# Patient Record
Sex: Female | Born: 1965 | Race: White | Hispanic: No | Marital: Married | Smoking: Never smoker
Health system: Southern US, Community
[De-identification: ages and names within clinical notes are randomized; demographics above are authoritative.]

## PROBLEM LIST (undated history)

## (undated) DIAGNOSIS — E079 Disorder of thyroid, unspecified: Secondary | ICD-10-CM

## (undated) HISTORY — DX: Disorder of thyroid, unspecified: E07.9

---

## 1998-01-17 ENCOUNTER — Other Ambulatory Visit: Admission: RE | Admit: 1998-01-17 | Discharge: 1998-01-17 | Payer: Self-pay | Admitting: Obstetrics & Gynecology

## 1998-09-27 ENCOUNTER — Other Ambulatory Visit: Admission: RE | Admit: 1998-09-27 | Discharge: 1998-09-27 | Payer: Self-pay | Admitting: *Deleted

## 1999-03-13 ENCOUNTER — Ambulatory Visit (HOSPITAL_COMMUNITY): Admission: AD | Admit: 1999-03-13 | Discharge: 1999-03-13 | Payer: Self-pay | Admitting: Obstetrics and Gynecology

## 1999-03-13 ENCOUNTER — Encounter: Payer: Self-pay | Admitting: Obstetrics & Gynecology

## 1999-05-01 ENCOUNTER — Inpatient Hospital Stay (HOSPITAL_COMMUNITY): Admission: AD | Admit: 1999-05-01 | Discharge: 1999-05-05 | Payer: Self-pay | Admitting: Obstetrics and Gynecology

## 1999-09-30 ENCOUNTER — Other Ambulatory Visit: Admission: RE | Admit: 1999-09-30 | Discharge: 1999-09-30 | Payer: Self-pay | Admitting: Obstetrics & Gynecology

## 2000-10-19 ENCOUNTER — Other Ambulatory Visit: Admission: RE | Admit: 2000-10-19 | Discharge: 2000-10-19 | Payer: Self-pay | Admitting: Obstetrics and Gynecology

## 2001-09-21 ENCOUNTER — Other Ambulatory Visit: Admission: RE | Admit: 2001-09-21 | Discharge: 2001-09-21 | Payer: Self-pay | Admitting: Obstetrics and Gynecology

## 2003-04-21 ENCOUNTER — Encounter: Admission: RE | Admit: 2003-04-21 | Discharge: 2003-04-21 | Payer: Self-pay | Admitting: Obstetrics and Gynecology

## 2003-04-21 ENCOUNTER — Encounter: Payer: Self-pay | Admitting: Obstetrics and Gynecology

## 2003-06-15 ENCOUNTER — Other Ambulatory Visit: Admission: RE | Admit: 2003-06-15 | Discharge: 2003-06-15 | Payer: Self-pay | Admitting: Obstetrics and Gynecology

## 2004-06-20 ENCOUNTER — Other Ambulatory Visit: Admission: RE | Admit: 2004-06-20 | Discharge: 2004-06-20 | Payer: Self-pay | Admitting: Obstetrics and Gynecology

## 2004-12-27 ENCOUNTER — Emergency Department (HOSPITAL_COMMUNITY): Admission: EM | Admit: 2004-12-27 | Discharge: 2004-12-27 | Payer: Self-pay | Admitting: Emergency Medicine

## 2004-12-27 ENCOUNTER — Ambulatory Visit: Payer: Self-pay | Admitting: *Deleted

## 2004-12-31 ENCOUNTER — Ambulatory Visit: Payer: Self-pay | Admitting: Internal Medicine

## 2005-01-07 ENCOUNTER — Encounter (HOSPITAL_COMMUNITY): Admission: RE | Admit: 2005-01-07 | Discharge: 2005-04-07 | Payer: Self-pay | Admitting: Internal Medicine

## 2005-02-06 ENCOUNTER — Ambulatory Visit: Payer: Self-pay | Admitting: Endocrinology

## 2005-02-25 ENCOUNTER — Ambulatory Visit: Payer: Self-pay | Admitting: Endocrinology

## 2005-03-25 ENCOUNTER — Ambulatory Visit: Payer: Self-pay | Admitting: Endocrinology

## 2005-04-15 ENCOUNTER — Ambulatory Visit: Payer: Self-pay | Admitting: Cardiology

## 2005-10-07 ENCOUNTER — Other Ambulatory Visit: Admission: RE | Admit: 2005-10-07 | Discharge: 2005-10-07 | Payer: Self-pay | Admitting: Obstetrics and Gynecology

## 2006-05-11 ENCOUNTER — Ambulatory Visit (HOSPITAL_COMMUNITY): Admission: RE | Admit: 2006-05-11 | Discharge: 2006-05-11 | Payer: Self-pay | Admitting: Obstetrics and Gynecology

## 2007-05-31 ENCOUNTER — Ambulatory Visit (HOSPITAL_COMMUNITY): Admission: RE | Admit: 2007-05-31 | Discharge: 2007-05-31 | Payer: Self-pay | Admitting: Obstetrics and Gynecology

## 2008-06-01 ENCOUNTER — Ambulatory Visit (HOSPITAL_COMMUNITY): Admission: RE | Admit: 2008-06-01 | Discharge: 2008-06-01 | Payer: Self-pay | Admitting: Obstetrics and Gynecology

## 2009-06-05 ENCOUNTER — Ambulatory Visit (HOSPITAL_COMMUNITY): Admission: RE | Admit: 2009-06-05 | Discharge: 2009-06-05 | Payer: Self-pay | Admitting: Obstetrics and Gynecology

## 2010-08-31 ENCOUNTER — Encounter: Payer: Self-pay | Admitting: Obstetrics and Gynecology

## 2010-12-27 NOTE — Consult Note (Signed)
NAMEULLA, MCKIERNAN               ACCOUNT NO.:  [id number]   MEDICAL RECORD NO.:  [id number]           PATIENT TYPE:   LOCATION:                               FACILITY:  [facility]   PHYSICIAN:  Rollene Rotunda, M.D.   DATE OF BIRTH:  1965/11/17   DATE OF CONSULTATION:  12/27/2004  DATE OF DISCHARGE:                                   CONSULTATION   CONSULTING PHYSICIAN:  Rollene Rotunda, M.D.   PRIMARY:  [facility].   REASON FOR PRESENTATION:  A patient with chest discomfort.   HISTORY OF PRESENT ILLNESS:  The patient is a pleasant 45 year old white  female with no past medical history or cardiac history.  She is quite  active.  Today while talking with her husband, she felt some left substernal  chest discomfort.  This was a tightness and pressure with a knotting.  It  was exactly the same as indigestion she had at the end of her pregnancy.  It  waxed and waned over 40 minutes.  She also had some right jaw and neck  tingling that she did not experience before.  She called the [facility] and went to her primary care office at [facility].  There she was urged to come to the emergency room.  She took aspirin.  Incidentally, Tums at home did not seem to improve this pain.  She had no  associated symptoms such as nausea, vomiting, or diaphoresis.  She has had  no palpitations, presyncope, or syncope.  She has had no shortness of  breath, no PND, or orthopnea.  She is very active.  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  She exercises almost daily.  With this aggressive  level of activity including this week, she has no cardiac symptoms.   PAST MEDICAL HISTORY:  There is no history of hypertension, diabetes, or  hyperlipidemia.   PAST SURGICAL HISTORY:  None.   ALLERGIES:  None.   MEDICATIONS:  Multivitamin, calcium supplements.   SOCIAL HISTORY:  The patient lives with her husband and son of 5.5-years.  She is a stay at home Mom.  She exercises  routinely.  She has never smoked  cigarettes.  She does not drink alcohol.   FAMILY HISTORY:  Contributory for a Mom having peripheral vascular disease  but no cardiac problems.  Both her Mom and her Dad smoke heavily.  Otherwise  there are no first degree relatives with early onset coronary disease.   REVIEW OF SYSTEMS:  As stated in the HPI.  Negative for other systems.   PHYSICAL EXAMINATION:  GENERAL:  The patient is well appearing in no  distress.  VITAL SIGNS:  Blood pressure 114/65, heart rate 72 and regular, afebrile.  HEENT:  Eyelids unremarkable.  Pupils equal, round, reactive to light.  Fundi not visualized.  Oral mucosa unremarkable.  NECK:  No jugular venous distention.  Wave form within normal limits.  Carotid upstroke brisk and symmetric.  No bruits.  No thyromegaly.  LYMPHATICS:  No cervical, axillary, or inguinal adenopathy.  LUNGS:  Clear to auscultation  bilaterally.  BACK:  No costovertebral angle tenderness.  CHEST:  Unremarkable.  HEART:  PMI not displaced or sustained.  S1 and S2 within normal limits.  No  S3.  No S4.  No murmurs.  ABDOMEN:  Flat.  Positive bowel sounds, normal in frequency and pitch.  No  bruits.  No rebound.  No guarding.  No midline pulsatile mass.  No  hepatomegaly.  No splenomegaly.  SKIN:  No rashes.  No nodules.  EXTREMITIES:  With 2+ pulses throughout.  No edema.  No cyanosis.  No  clubbing.  NEUROLOGIC:  Oriented to person, place and time.  Cranial nerves II-XII  grossly intact.  Motor grossly intact.   EKG:  Sinus rhythm, rightward axis, intervals within normal limits, no acute  ST-T wave changes.   LABS:  WBC 6.5, hemoglobin 15.2, sodium 138, potassium 4.1, BUN 13,  creatinine 0.9.  Magnesium 2.4.  D-dimmer 0.23.  PT 12.5, INR 0.9.  Calcium  10, total protein 7.4, albumin 4.2, AST 27, ALT 57, alkaline phosphatase 68,  bilirubin 0.7.  Troponin 0.01, MB 0.4, CK 45.   ASSESSMENT/PLAN:  Chest pain.  The patient's chest discomfort is  very  atypical for angina.  She is extremely functional and active without ever  developing any symptoms.  She has very minimal risk factors with a  borderline family history.  She has a normal physical exam and normal EKG.  Her set of cardiac enzymes are unremarkable.  Given all of this, the pretest  probability of obstructive coronary disease leading to her symptoms is  extremely low.  At this point, I think it is very safe to send her home.  She can come back if she has any recurrent chest discomfort.  We will plan  on following her up in the office, and we will perform an exercise treadmill  test.                                        ___________________________________________  Rollene Rotunda, M.D.    JH/MEDQ  D:  12/27/2004  T:  12/28/2004  Job:  [id number]

## 2011-09-29 ENCOUNTER — Other Ambulatory Visit: Payer: Self-pay | Admitting: Family Medicine

## 2011-09-29 MED ORDER — LEVOTHYROXINE SODIUM 75 MCG PO TABS: 75.0000 ug | ORAL_TABLET | Freq: Every day | ORAL | Status: DC

## 2011-11-04 ENCOUNTER — Ambulatory Visit (INDEPENDENT_AMBULATORY_CARE_PROVIDER_SITE_OTHER): Payer: BC Managed Care – PPO | Admitting: Internal Medicine

## 2011-11-04 VITALS — BP 107/66 | HR 57 | Temp 98.7°F | Resp 18 | Ht 65.35 in | Wt 153.6 lb

## 2011-11-04 DIAGNOSIS — E039 Hypothyroidism, unspecified: Secondary | ICD-10-CM

## 2011-11-04 LAB — T4, FREE: Free T4: 1.38 ng/dL (ref 0.80–1.80)

## 2011-11-04 MED ORDER — LEVOTHYROXINE SODIUM 75 MCG PO TABS: 75.0000 ug | ORAL_TABLET | Freq: Every day | ORAL | Status: DC

## 2011-11-04 NOTE — Progress Notes (Signed)
  Subjective:    Patient ID: Melissa Mosley, female    DOB: [birth year], 46 y.o.   MRN: [id number]  HPIHere for yearly followup of hypothyroidism Doing very well Advanced to 4 level akido Lives with husband and 40 year old / 82 year old sonAnd needs occasional family therapy to keep this together Counseling practice on [address]is going well    Review of SystemsNegative    Objective:   Physical Exam Vital signs are stable No thyromegaly or nodules       Assessment & Plan:  Hypothyroidism  Check T3-T4 TSH Refilled Synthroid 75 mcg for one year

## 2011-11-05 ENCOUNTER — Encounter: Payer: Self-pay | Admitting: Internal Medicine

## 2011-11-06 ENCOUNTER — Telehealth: Payer: Self-pay

## 2011-11-06 NOTE — Telephone Encounter (Signed)
Pt expected a call yesterday, needs her thyroid results so she/we can determine if current dosage of meds is appropriate.

## 2011-11-06 NOTE — Telephone Encounter (Signed)
lmom that all labs are normal per dr Merla Riches and to continue with current medication regimen.

## 2011-11-19 ENCOUNTER — Ambulatory Visit (INDEPENDENT_AMBULATORY_CARE_PROVIDER_SITE_OTHER): Payer: BC Managed Care – PPO | Admitting: Internal Medicine

## 2011-11-19 ENCOUNTER — Ambulatory Visit
Admission: RE | Admit: 2011-11-19 | Discharge: 2011-11-19 | Disposition: A | Discharge status: Home / Self Care | Payer: Self-pay | Source: Ambulatory Visit | Attending: Internal Medicine | Admitting: Internal Medicine

## 2011-11-19 ENCOUNTER — Encounter: Payer: Self-pay | Admitting: Internal Medicine

## 2011-11-19 VITALS — BP 112/70 | HR 56 | Temp 98.0°F | Resp 16 | Ht 65.25 in | Wt 154.0 lb

## 2011-11-19 DIAGNOSIS — S0990XA Unspecified injury of head, initial encounter: Secondary | ICD-10-CM

## 2011-11-19 DIAGNOSIS — E039 Hypothyroidism, unspecified: Secondary | ICD-10-CM | POA: Insufficient documentation

## 2011-11-19 DIAGNOSIS — M542 Cervicalgia: Secondary | ICD-10-CM

## 2011-11-19 DIAGNOSIS — S069X0A Unspecified intracranial injury without loss of consciousness, initial encounter: Secondary | ICD-10-CM

## 2011-11-19 DIAGNOSIS — S0993XA Unspecified injury of face, initial encounter: Secondary | ICD-10-CM

## 2011-11-19 MED ORDER — CYCLOBENZAPRINE HCL 5 MG PO TABS
5.0000 mg | ORAL_TABLET | Freq: Three times a day (TID) | ORAL | Status: AC | PRN
Start: 2011-11-19 — End: 2011-11-29

## 2011-11-19 MED ORDER — KETOROLAC TROMETHAMINE 10 MG PO TABS: 10.0000 mg | ORAL_TABLET | Freq: Four times a day (QID) | ORAL | Status: AC | PRN

## 2011-11-19 NOTE — Progress Notes (Signed)
  Subjective:    Patient ID: Melissa Mosley, female    DOB: [birth year], 46 y.o.   MRN: [id number]  HPI Headache 6/10 Neck pain 5/10 in base of hte skull midline Nausea Dizzy Onset 2 days ago fell backwards during martial arts activity onto partners knee and then mat. No loc was stunned for a few minutes. Nausea and dizzy. Severe ha 8/10 in severity. Nausea and  photophobia has been worse over the past 2 days. Feels slightly unbalanced on the right side when walking. No cognitive abnormalities but it" hurts to think."    Review of Systems  Constitutional: Negative.   HENT: Positive for neck pain and neck stiffness.   Eyes: Negative.   Respiratory: Negative.   Cardiovascular: Negative.   Genitourinary: Negative.   Musculoskeletal: Positive for gait problem.  Skin: Negative.   Neurological: Positive for dizziness, light-headedness and headaches. Negative for syncope, weakness and numbness.  Hematological: Negative.   Psychiatric/Behavioral: Negative.   All other systems reviewed and are negative.       Objective:   Physical Exam  Nursing note and vitals reviewed. Constitutional: She is oriented to person, place, and time. She appears well-developed and well-nourished.  HENT:  Head: Normocephalic.  Right Ear: External ear normal.  Left Ear: External ear normal.  Nose: Nose normal.  Mouth/Throat: Oropharynx is clear and moist.       Tender at the base of the skull  Neck:       Tender base of the skull midline   Cardiovascular: Normal rate, regular rhythm and normal heart sounds.   Pulmonary/Chest: Effort normal and breath sounds normal.  Abdominal: Soft.  Musculoskeletal: She exhibits tenderness.       c spine midline  Neurological: She is alert and oriented to person, place, and time. She has normal reflexes. She displays normal reflexes. No cranial nerve deficit. She exhibits normal muscle tone. Coordination abnormal.       Cerebellar testing normal gross cognition normal    Skin: Skin is warm and dry.  Psychiatric: She has a normal mood and affect. Her behavior is normal. Judgment and thought content normal.          Assessment & Plan:  Concussive symptoms with neck pain and concern for cervical neck injury . Will obtain ct of the head and neck and rx with analgesics and muscle relaxants.

## 2011-11-19 NOTE — Patient Instructions (Signed)
Take meds as directed. Ct of head and neck today. Return for follow up as directed.

## 2011-11-26 ENCOUNTER — Telehealth: Payer: Self-pay

## 2011-11-26 NOTE — Telephone Encounter (Signed)
If pain/problems  have not improved needs to return to clinic (per Dr. Sanjuana Letters advice) This way we can evaluate, treat  and refer for current symptoms/issues.  (Has been over a week since injury)

## 2011-11-26 NOTE — Telephone Encounter (Signed)
Pt seen in office by Dr Mindi Junker diagnosed with a concussion she would like a call back to see if someone can refer her to a specialist who knows more about her diagnosis.

## 2011-11-26 NOTE — Telephone Encounter (Signed)
Patient had made other arrangements to be seen somewhere else.

## 2011-12-02 ENCOUNTER — Ambulatory Visit: Payer: BC Managed Care – PPO | Attending: Family Medicine | Admitting: Physical Therapy

## 2011-12-02 ENCOUNTER — Ambulatory Visit: Payer: BC Managed Care – PPO | Admitting: Rehabilitative and Restorative Service Providers"

## 2011-12-02 DIAGNOSIS — R269 Unspecified abnormalities of gait and mobility: Secondary | ICD-10-CM | POA: Insufficient documentation

## 2011-12-02 DIAGNOSIS — IMO0001 Reserved for inherently not codable concepts without codable children: Secondary | ICD-10-CM | POA: Insufficient documentation

## 2011-12-04 ENCOUNTER — Encounter: Payer: BC Managed Care – PPO | Admitting: Physical Therapy

## 2011-12-09 ENCOUNTER — Ambulatory Visit: Payer: BC Managed Care – PPO | Admitting: Physical Therapy

## 2011-12-12 ENCOUNTER — Ambulatory Visit (INDEPENDENT_AMBULATORY_CARE_PROVIDER_SITE_OTHER): Payer: BC Managed Care – PPO | Admitting: Obstetrics and Gynecology

## 2011-12-12 ENCOUNTER — Encounter: Payer: Self-pay | Admitting: Obstetrics and Gynecology

## 2011-12-12 VITALS — BP 102/56 | Resp 16 | Ht 66.0 in | Wt 153.0 lb

## 2011-12-12 DIAGNOSIS — Z01419 Encounter for gynecological examination (general) (routine) without abnormal findings: Secondary | ICD-10-CM

## 2011-12-12 DIAGNOSIS — Z30432 Encounter for removal of intrauterine contraceptive device: Secondary | ICD-10-CM

## 2011-12-12 NOTE — Progress Notes (Signed)
Subjective:    Melissa Mosley is a 46 y.o. female, No obstetric history on file., who presents for an annual exam.  Had a recent concussion following a fall in [place], recovering now.  Hx hypothyroidism, followed at primary.  No gyn issues. Questioning need/recommended frequency of mammogram.  Last one   History   Social History  . Marital Status: Married    Spouse Name: N/A    Number of Children: N/A  . Years of Education: N/A   Social History Main Topics  . Smoking status: Never Smoker   . Smokeless tobacco: Never Used  . Alcohol Use: No  . Drug Use: No  . Sexually Active: Not on file   Other Topics Concern  . Not on file   Social History Narrative  . No narrative on file    Menstrual cycle:   LMP: No LMP recorded. Patient is not currently having periods (Reason: IUD).           Cycle: Regular, monthly with normal flow and no severe dysmenorrha  The following portions of the patient's history were reviewed and updated as appropriate: allergies, current medications, past family history, past medical history, past social history, past surgical history and problem list.  Review of Systems Pertinent items are noted in HPI. Breast:Negative for breast lump,nipple discharge or nipple retraction Gastrointestinal: Negative for abdominal pain, change in bowel habits or rectal bleeding Urinary:negative   Objective:    BP 102/56  Resp 16  Ht 5\' 6"  (1.676 m)  Wt 153 lb (69.4 kg)  BMI 24.69 kg/m2    Weight:  Wt Readings from Last 1 Encounters:  12/12/11 153 lb (69.4 kg)          BMI: Body mass index is 24.69 kg/(m^2).  General Appearance: Alert, appropriate appearance for age. No acute distress HEENT: Grossly normal Neck / Thyroid: Supple, no masses, nodes or enlargement Lungs: clear to auscultation bilaterally Back: No CVA tenderness Breast Exam: No masses or nodes.No dimpling, nipple retraction or discharge. Cardiovascular: Regular rate and rhythm. S1, S2, no  murmur Gastrointestinal: Soft, non-tender, no masses or organomegaly Pelvic Exam: Vulva and vagina appear normal. Bimanual exam reveals normal uterus and adnexa. Rectovaginal: not indicated and normal rectal, no masses Lymphatic Exam: Non-palpable nodes in neck, clavicular, axillary, or inguinal regions  Skin: no rash or abnormalities Neurologic: Normal gait and speech, no tremor  Psychiatric: Alert and oriented, appropriate affect.  IUD string visible at cervical os--removed without difficulty  Wet Prep:not applicable Urinalysis:not applicable UPT: Not done   Assessment:    Normal gyn exam  Desired IUD removal--done.    Plan:    Pap smear Mammogram discussed--patient willing to do mg every 2-3 years (plans one in 2013). return annually or prn STD screening: declined Contraception:vasectomy (IUD removed today)      Toure Edmonds, VICKIMD

## 2011-12-12 NOTE — Progress Notes (Signed)
The patient reports: pt states that she had concussion a couple weeks ago has been having trouble with bp ever since   Contraception:IUD  Last mammogram: pt had mammo 2011 and was normal  Last pap: states that has been a couple years  GC/Chlamydia cultures offered: declined HIV/RPR/HbsAg offered:  declined HSV 1 and 2 glycoprotein offered: declined  Menstrual cycle regular and monthly: No: IUD Menstrual flow normal: No: IUD  Urinary symptoms: none Normal bowel movements: Yes Reports abuse at home: No:

## 2011-12-16 ENCOUNTER — Encounter: Payer: BC Managed Care – PPO | Admitting: Physical Therapy

## 2011-12-17 LAB — PAP IG W/ RFLX HPV ASCU

## 2012-02-17 ENCOUNTER — Ambulatory Visit (INDEPENDENT_AMBULATORY_CARE_PROVIDER_SITE_OTHER): Payer: BC Managed Care – PPO | Admitting: Physician Assistant

## 2012-02-17 VITALS — BP 122/86 | HR 68 | Temp 98.6°F | Resp 18 | Ht 66.0 in | Wt 154.0 lb

## 2012-02-17 DIAGNOSIS — Z0000 Encounter for general adult medical examination without abnormal findings: Secondary | ICD-10-CM

## 2012-02-17 DIAGNOSIS — Z111 Encounter for screening for respiratory tuberculosis: Secondary | ICD-10-CM

## 2012-02-17 LAB — POCT URINALYSIS DIPSTICK
Blood, UA: NEGATIVE
Glucose, UA: NEGATIVE
Urobilinogen, UA: 0.2
pH, UA: 6.5

## 2012-02-17 NOTE — Progress Notes (Signed)
  Subjective:    Patient ID: Melissa Mosley, female    DOB: July 07, 1966, 46 y.o.   MRN: [id number]  HPI  Pt presents to clinic for a CPE.  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  She had her pap smear 5/13.  She has had normal cholesterol 2010.  Her thyroid was nl in 3/13.  She has no complaints today.  She has been increasing her exercise since her concussion in 4/13 due to HAs with exercise but they are improving.    Review of Systems Neg. See scanned health survey.     Objective:   Physical Exam  Nursing note and vitals reviewed. Constitutional: She is oriented to person, place, and time. Vital signs are normal. She appears well-developed and well-nourished.  HENT:  Head: Normocephalic and atraumatic.  Right Ear: External ear normal.  Left Ear: External ear normal.  Nose: Nose normal.  Mouth/Throat: Oropharynx is clear and moist.  Eyes: Conjunctivae and EOM are normal. Pupils are equal, round, and reactive to light. Right eye exhibits no discharge. No scleral icterus.  Neck: Normal range of motion. Neck supple. Carotid bruit is not present. No tracheal deviation present. No mass and no thyromegaly present.  Cardiovascular: Normal rate, regular rhythm, normal heart sounds and intact distal pulses.  Exam reveals no gallop and no friction rub.   No murmur heard. Pulmonary/Chest: Effort normal and breath sounds normal.  Abdominal: Soft. Normal appearance and bowel sounds are normal. There is no tenderness.  Lymphadenopathy:    She has no cervical adenopathy.  Neurological: She is alert and oriented to person, place, and time. She has normal reflexes.  Skin: Skin is warm and dry.  Psychiatric: She has a normal mood and affect. Her behavior is normal. Judgment and thought content normal.    Results for orders placed in visit on 02/17/12  POCT URINALYSIS DIPSTICK      Component Value Range   Color, UA yellow     Clarity, UA clear     Glucose, UA neg       Bilirubin, UA neg     Ketones, UA neg     Spec Grav, UA 1.020     Blood, UA neg     pH, UA 6.5     Protein, UA trace     Urobilinogen, UA 0.2     Nitrite, UA neg     Leukocytes, UA Trace           Assessment & Plan:   1. Annual physical exam  POCT urinalysis dipstick, TB Skin Test  2. Screening-pulmonary TB     Pt to RTC 48-72 h for PPD reading. Form filled out for school physical.

## 2012-02-17 NOTE — Patient Instructions (Signed)
Given Healthy handout.

## 2012-02-19 ENCOUNTER — Ambulatory Visit (INDEPENDENT_AMBULATORY_CARE_PROVIDER_SITE_OTHER): Payer: BC Managed Care – PPO | Admitting: *Deleted

## 2012-02-19 DIAGNOSIS — Z111 Encounter for screening for respiratory tuberculosis: Secondary | ICD-10-CM

## 2012-03-30 ENCOUNTER — Encounter: Payer: Self-pay | Admitting: Internal Medicine

## 2012-09-07 ENCOUNTER — Ambulatory Visit (INDEPENDENT_AMBULATORY_CARE_PROVIDER_SITE_OTHER): Payer: BC Managed Care – PPO | Admitting: Family Medicine

## 2012-09-07 VITALS — BP 106/66 | HR 60 | Temp 98.0°F | Resp 14 | Ht 65.75 in | Wt 155.0 lb

## 2012-09-07 DIAGNOSIS — S40012A Contusion of left shoulder, initial encounter: Secondary | ICD-10-CM | POA: Insufficient documentation

## 2012-09-07 DIAGNOSIS — S199XXA Unspecified injury of neck, initial encounter: Secondary | ICD-10-CM

## 2012-09-07 DIAGNOSIS — M542 Cervicalgia: Secondary | ICD-10-CM

## 2012-09-07 DIAGNOSIS — S0993XA Unspecified injury of face, initial encounter: Secondary | ICD-10-CM

## 2012-09-07 DIAGNOSIS — S060X9A Concussion with loss of consciousness of unspecified duration, initial encounter: Secondary | ICD-10-CM | POA: Insufficient documentation

## 2012-09-07 DIAGNOSIS — S069X0A Unspecified intracranial injury without loss of consciousness, initial encounter: Secondary | ICD-10-CM

## 2012-09-07 NOTE — Progress Notes (Signed)
Melissa Mosley is a 47 y.o. female who presents to [facility] today for  1) left shoulder pain. Patient is avid Akido participant to 4 weeks ago was doing a roll. She landed on the point of the left shoulder.  This resulted in immediate pain. The pain was mild to moderate however it has persisted for 4 weeks.  She has been avoiding her usual weight lifting and martial arts activity.  She denies any pain with overhand motion or reaching back, or at night. She's tried some over-the-counter pain medications which have helped a bit. She denies any neck pain radiating pain weakness or numbness.  She has a history of a left clavicle fracture years ago.   2) old concussion. Patient suffered a concussion April 2013. She had significant dizziness and vertigo symptoms with this which was well treated with vestibular rehabilitation. She notes mild continued vertigo with heavy rotation and jumping on trampoline.  She wonders if there's anything she should do.    PMH: Reviewed hypothyroidism History  Substance Use Topics  . Smoking status: Never Smoker   . Smokeless tobacco: Never Used  . Alcohol Use: No   ROS as above  Medications reviewed. Current Outpatient Prescriptions  Medication Sig Dispense Refill  . levothyroxine (SYNTHROID, LEVOTHROID) 75 MCG tablet Take 1 tablet (75 mcg total) by mouth daily.  90 tablet  3    Exam:  BP 106/66  Pulse 60  Temp 98 F (36.7 C) (Oral)  Resp 14  Ht 5' 5.75" (1.67 m)  Wt 155 lb (70.308 kg)  BMI 25.21 kg/m2  SpO2 99%  LMP 09/01/2012 Gen: Well NAD LEFT SHOULDER: Mildly tender to palpation over the point of the spine of the scapula.  Normal range of motion. Mild pain with resisted external rotation however strength is 5/5. Normal strength abduction and internal rotation. Negative impingement testing. Negative Yergason's and speeds test.  Normal pulses capillary refill and sensation distally.  Neuro: Alert and oriented normal balance and gait  No results found  for this or any previous visit (from the past 72 hour(s)).  Assessment and Plan: 47 y.o. female with  1) shoulder contusion:  Rest, shoulder rehabilitation home exercise program. Focus on external rotation strengthening.  Reintroduce heavy activities were no longer painful.  Followup in a few weeks as needed 2) old concussion: Advise continuing vestibular rehabilitation program. Discussed some simple exercises.  Followup as needed

## 2012-09-07 NOTE — Patient Instructions (Addendum)
Thank you for coming in today. 1) Shoulder: I think you bruised the point of the spine of the scapula.  This will get better. You may of also hurt the infraspinatus muscle that is right there. Start rehabilitation with resisted external rotation. 10-30 repetitions several times a day.  2) old concussion: Restart the vestibular rehabilitation exercises.   Come back if not improved in a few weeks.

## 2012-09-12 NOTE — Progress Notes (Signed)
Note reviewed, and agree with documentation and plan.  

## 2012-09-13 ENCOUNTER — Telehealth: Payer: Self-pay | Admitting: Obstetrics and Gynecology

## 2012-09-13 NOTE — Telephone Encounter (Signed)
Advised pt that mmg from 2010 states scattered fibroglandular densities.  Pt voiced understanding   Darien Ramus, CMA

## 2012-10-22 ENCOUNTER — Ambulatory Visit (INDEPENDENT_AMBULATORY_CARE_PROVIDER_SITE_OTHER): Payer: BC Managed Care – PPO | Admitting: Family Medicine

## 2012-10-22 VITALS — BP 96/60 | HR 62 | Temp 98.2°F | Resp 16 | Ht 66.0 in | Wt 154.8 lb

## 2012-10-22 DIAGNOSIS — E039 Hypothyroidism, unspecified: Secondary | ICD-10-CM

## 2012-10-22 LAB — TSH: TSH: 0.762 u[IU]/mL (ref 0.350–4.500)

## 2012-10-22 MED ORDER — LEVOTHYROXINE SODIUM 75 MCG PO TABS: 75.0000 ug | ORAL_TABLET | Freq: Every day | ORAL | Status: DC

## 2012-10-22 NOTE — Progress Notes (Signed)
47 yo woman with hypothyroidism for 9 years after several episodes of elevated thyroid hormone.  No changes in symptoms over the past year.  O:  Discussed thyroid disease Thyroid: not enlarged Pulse 60  A:  Hypothyroid   P:  Refill meds. Unspecified hypothyroidism - Plan: TSH, levothyroxine (SYNTHROID, LEVOTHROID) 75 MCG tablet

## 2012-10-22 NOTE — Patient Instructions (Signed)

## 2012-11-15 ENCOUNTER — Telehealth: Payer: Self-pay

## 2012-11-15 NOTE — Telephone Encounter (Signed)
[place] and is requesting a "precautionary rx" for Cipro Please advise if this can be prescribed.  Pt uses [facility] [address]

## 2012-11-15 NOTE — Telephone Encounter (Signed)
Is this your patient? She does not list you as PCP. Advise please if you will do this.

## 2012-11-16 NOTE — Telephone Encounter (Signed)
Patient advised.

## 2012-11-16 NOTE — Telephone Encounter (Signed)
I agree, PCP should write for this

## 2013-02-07 ENCOUNTER — Other Ambulatory Visit: Payer: Self-pay | Admitting: Obstetrics and Gynecology

## 2013-02-07 DIAGNOSIS — Z1231 Encounter for screening mammogram for malignant neoplasm of breast: Secondary | ICD-10-CM

## 2013-02-15 ENCOUNTER — Ambulatory Visit (HOSPITAL_COMMUNITY)
Admission: RE | Admit: 2013-02-15 | Discharge: 2013-02-15 | Disposition: A | Discharge status: Home / Self Care | Payer: BC Managed Care – PPO | Source: Ambulatory Visit | Attending: Obstetrics and Gynecology | Admitting: Obstetrics and Gynecology

## 2013-02-15 DIAGNOSIS — Z1231 Encounter for screening mammogram for malignant neoplasm of breast: Secondary | ICD-10-CM | POA: Insufficient documentation

## 2013-07-16 IMAGING — CT CT HEAD W/O CM
4 of 7 series · 17 of 37 positions shown, 19 images · non-contrast
Comparison: None

CT HEAD

CLINICAL DATA: Nola Oxendine injury.  Trauma to the base of the
skull in the occipital region.  Headache.  eye pain.
Photosensitivity.  Neck pain.

CT HEAD WITHOUT CONTRAST
CT CERVICAL SPINE WITHOUT CONTRAST
TECHNIQUE: Multidetector CT imaging of the head and cervical spine
was performed following the standard protocol without intravenous
contrast.  Multiplanar CT image reconstructions of the cervical
spine were also generated.

[Series 3: head bone · axial · 0.45mm/px · z∈[+46,+135]mm · 4 of 56 slices shown]
[im 12/56  bone]
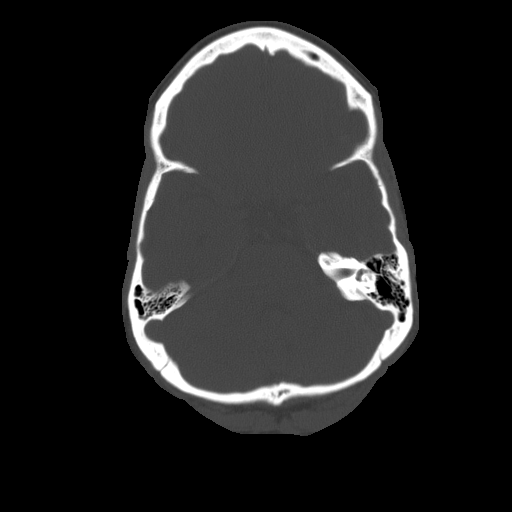
[im 23/56  bone]
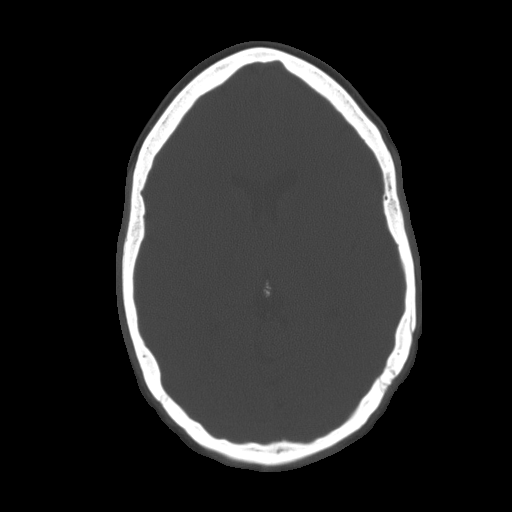
[im 34/56  bone]
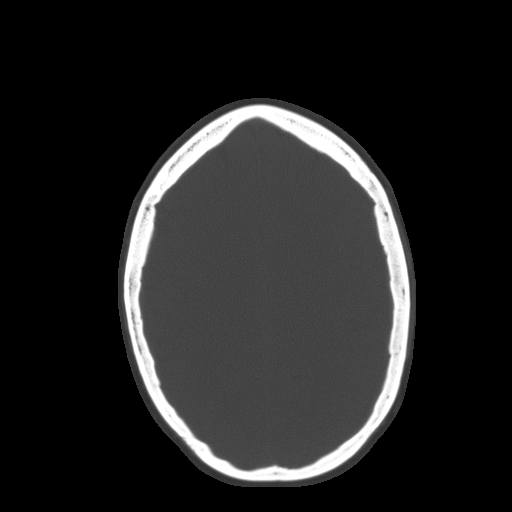
[im 45/56  bone]
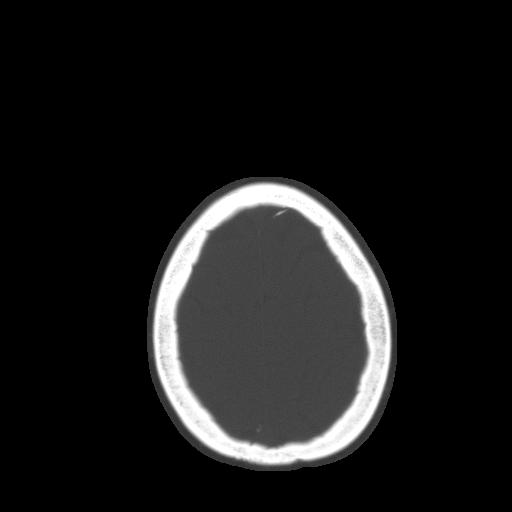

[Series 6: c spine soft · axial · 0.23mm/px · z∈[-169,-86]mm · 4 of 66 slices shown]
[im 11/66  brain]
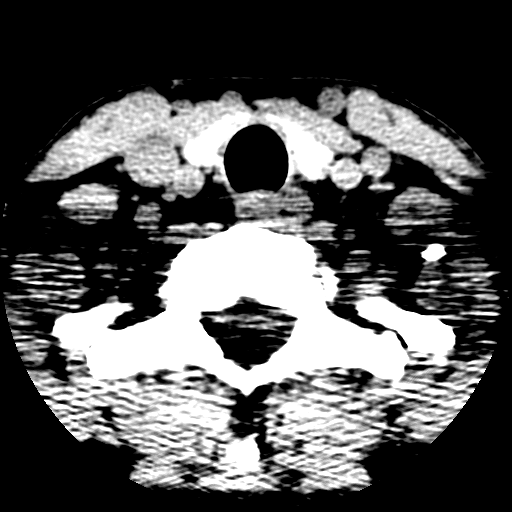
[im 22/66  brain]
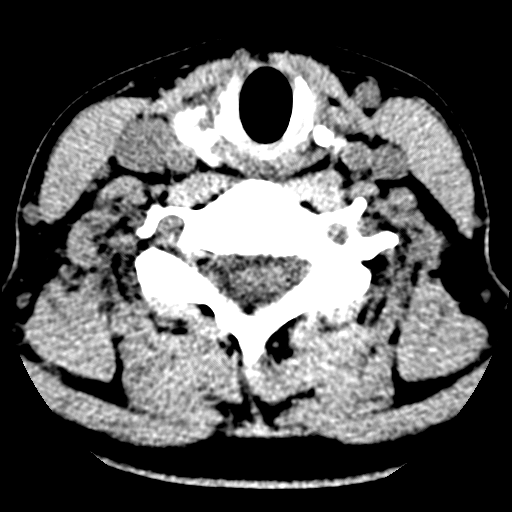
[im 33/66  brain]
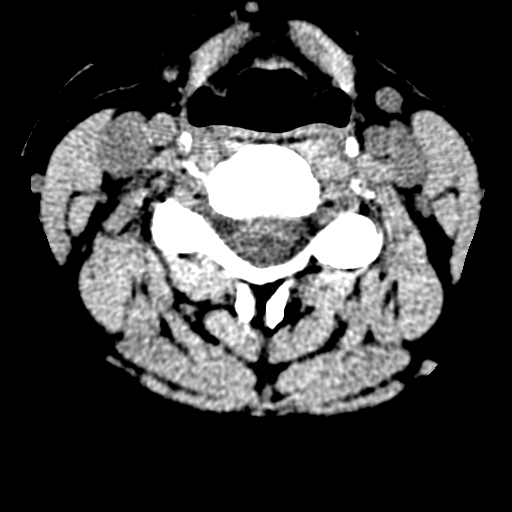
[im 44/66  brain]
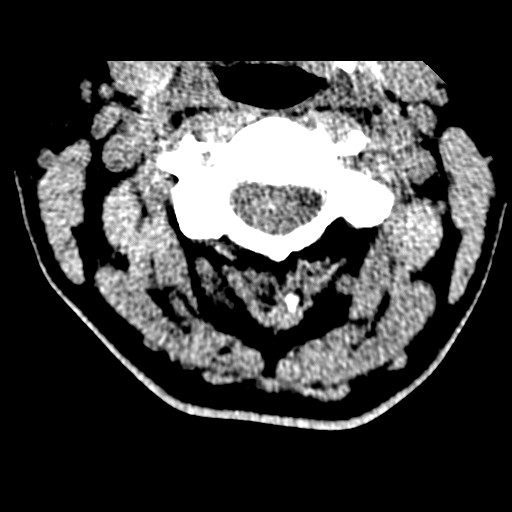

[Series 106: axial · axial · 0.23mm/px · z∈[-176,-68]mm · 6 of 82 slices shown, 8 images]
[im 12/82  brain]
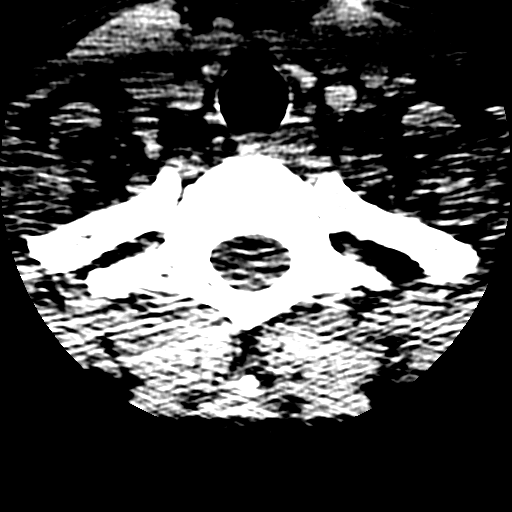
[im 12/82  bone]
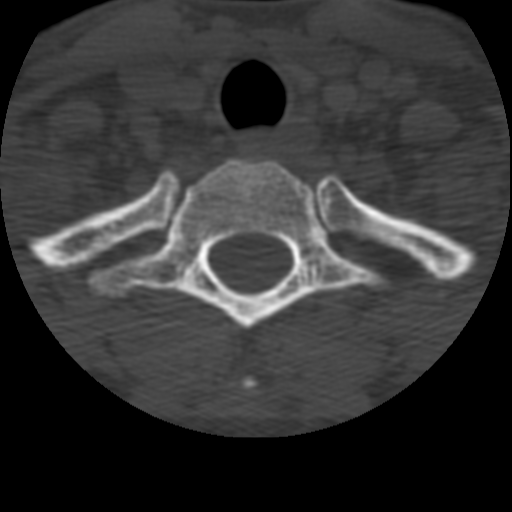
[im 24/82  brain]
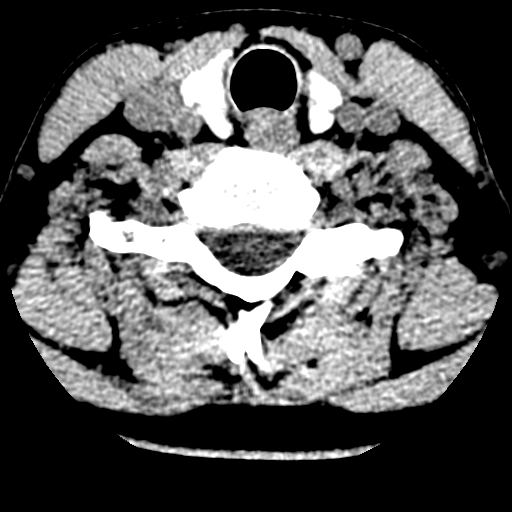
[im 35/82  brain]
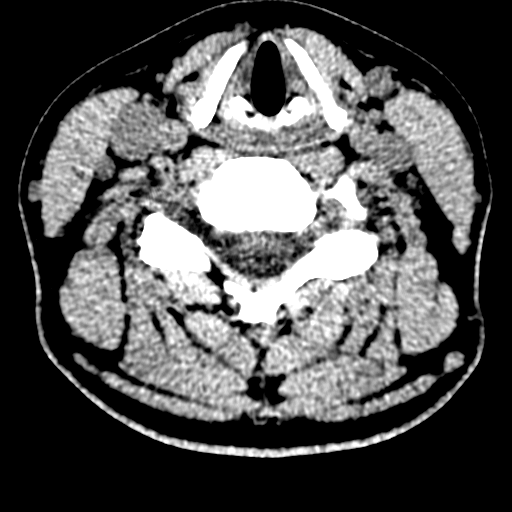
[im 47/82  brain]
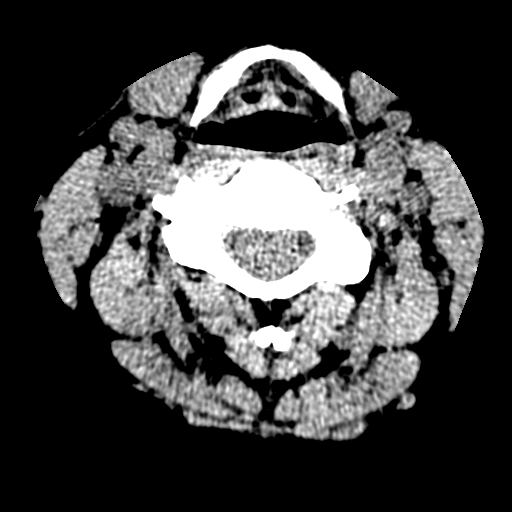
[im 58/82  brain]
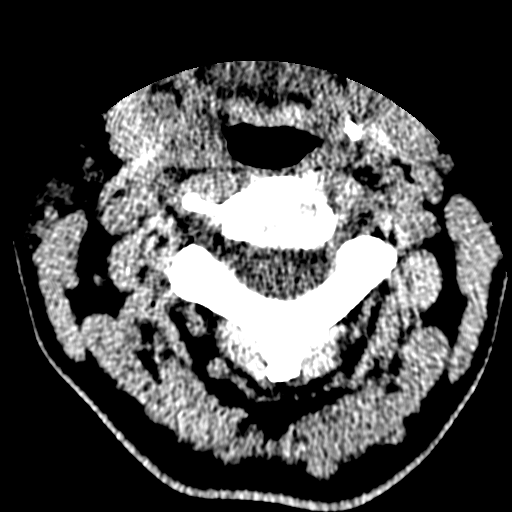
[im 58/82  bone]
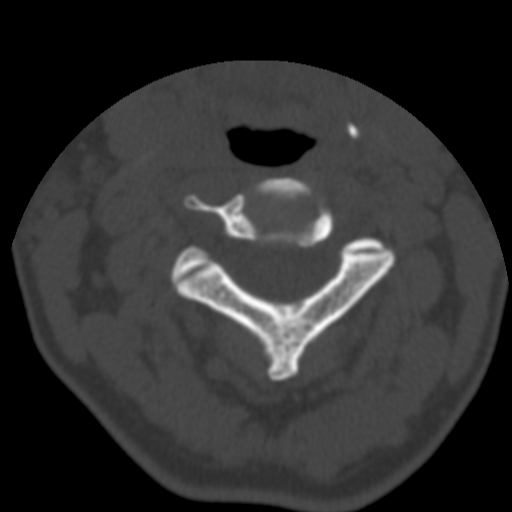
[im 70/82  brain]
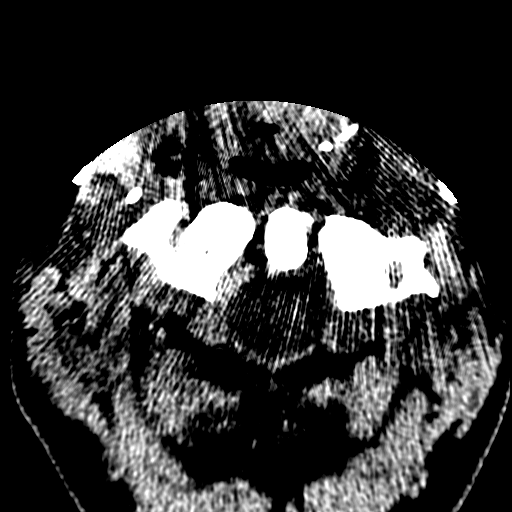

[Series 700: cor · coronal · 0.32mm/px · 3 of 33 slices shown]
[im 6/33  brain]
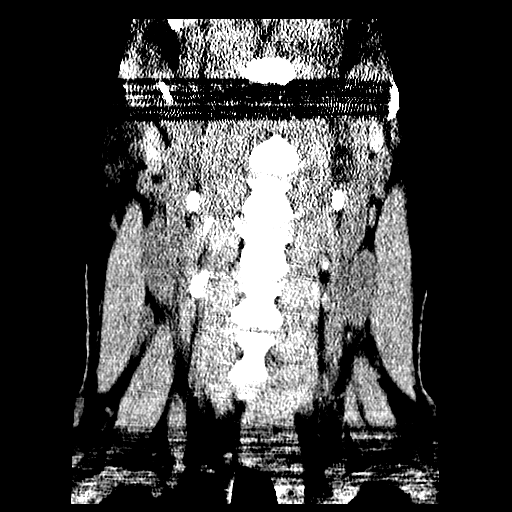
[im 10/33  brain]
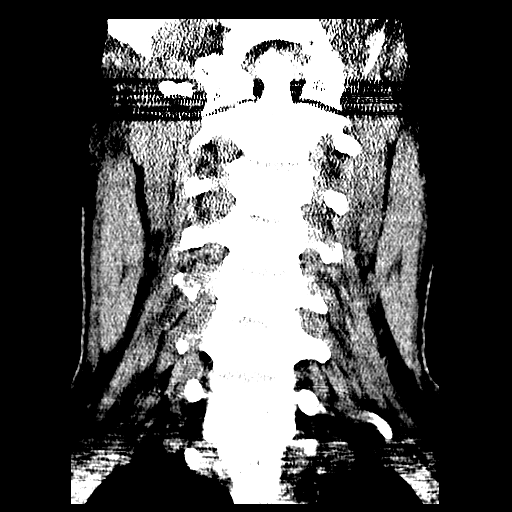
[im 14/33  brain]
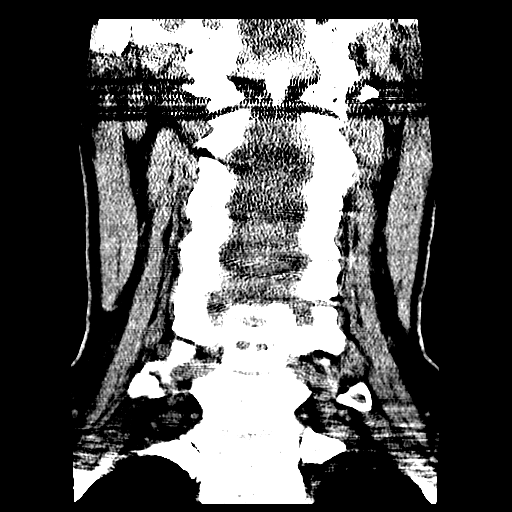

[17 of 37 positions shown; findings below may reference images not displayed]

FINDINGS: The brain has a normal appearance without evidence of
malformation, atrophy, old or acute infarction, mass lesion,
hemorrhage, hydrocephalus or extra-axial collection.  No skull
fracture.  No fluid in the sinuses, middle ears or mastoids.
Visualized orbital structures appear normal.
IMPRESSION: Normal head CT

CT CERVICAL SPINE
FINDINGS: Alignment is normal.  No fracture.  No degenerative
change.  No other focal lesion.
IMPRESSION: Normal CT scan of the cervical spine.

## 2013-10-05 ENCOUNTER — Other Ambulatory Visit: Payer: Self-pay | Admitting: Family Medicine

## 2013-10-05 ENCOUNTER — Ambulatory Visit (INDEPENDENT_AMBULATORY_CARE_PROVIDER_SITE_OTHER): Payer: BC Managed Care – PPO | Admitting: Internal Medicine

## 2013-10-05 VITALS — BP 88/60 | HR 114 | Temp 98.6°F | Resp 16 | Ht 65.5 in | Wt 162.8 lb

## 2013-10-05 DIAGNOSIS — E039 Hypothyroidism, unspecified: Secondary | ICD-10-CM

## 2013-10-05 DIAGNOSIS — R5383 Other fatigue: Secondary | ICD-10-CM

## 2013-10-05 DIAGNOSIS — R5381 Other malaise: Secondary | ICD-10-CM

## 2013-10-05 DIAGNOSIS — G478 Other sleep disorders: Secondary | ICD-10-CM

## 2013-10-05 DIAGNOSIS — D649 Anemia, unspecified: Secondary | ICD-10-CM

## 2013-10-05 LAB — CBC
HCT: 37 % (ref 36.0–46.0)
Hemoglobin: 12.1 g/dL (ref 12.0–15.0)
MCH: 25.4 pg — ABNORMAL LOW (ref 26.0–34.0)
MCHC: 32.7 g/dL (ref 30.0–36.0)
MCV: 77.6 fL — ABNORMAL LOW (ref 78.0–100.0)
PLATELETS: 359 10*3/uL (ref 150–400)
RBC: 4.77 MIL/uL (ref 3.87–5.11)
RDW: 16.2 % — ABNORMAL HIGH (ref 11.5–15.5)
WBC: 6.1 10*3/uL (ref 4.0–10.5)

## 2013-10-05 MED ORDER — LEVOTHYROXINE SODIUM 75 MCG PO TABS: 75.0000 ug | ORAL_TABLET | Freq: Every day | ORAL | Status: DC

## 2013-10-05 NOTE — Progress Notes (Signed)
Subjective:    Patient ID: Melissa Mosley, female    DOB: [birth year], 48 y.o.   MRN: [id number] This chart was scribed for Tonye Pearson, MD by Danella Maiers, ED Scribe. This patient was seen in room 27 and the patient's care was started at 3:13 PM.  Chief Complaint  Patient presents with   Medication Refill    SYNTHROID    HPI HPI Comments: Melissa Mosley is a 47 y.o. female who presents to the [facility] for a medication refill on synthroid.  She reports constant fatigue recently, noticed by her son. She states after being up for only 30 minutes in the morning she feels ready for a nap. She states it takes her a long time to get out of bed in the morning recently, although her bed time schedule has not changed. She denies trouble falling asleep, staying asleep, or waking up gasping for air. She states she gets sleepy on long drives. She states she failed the iron test trying to give blood last week. Her iron was 12.1. She denies heavy periods.  She states she has had to quit running after a mile today due to knee pain. She states she was able to walk without any problems.   PCP - Nigel Bridgeman, CNM  Patient Active Problem List   Diagnosis Date Noted   Concussion 09/07/2012   Contusion of shoulder, left 09/07/2012   Hypothyroid 11/19/2011   Current Outpatient Prescriptions on File Prior to Visit  Medication Sig Dispense Refill   levothyroxine (SYNTHROID, LEVOTHROID) 75 MCG tablet Take 1 tablet (75 mcg total) by mouth daily.  90 tablet  3   No current facility-administered medications on file prior to visit.    Review of Systems  Constitutional: Negative for activity change, appetite change and unexpected weight change.  Eyes: Negative for visual disturbance.  Respiratory: Negative for cough and shortness of breath.   Cardiovascular: Negative for chest pain, palpitations and leg swelling.  Gastrointestinal: Negative for diarrhea and constipation.    Genitourinary: Negative for difficulty urinating.  Neurological: Negative for headaches.       Objective:   Physical Exam  Nursing note and vitals reviewed. Constitutional: She is oriented to person, place, and time. She appears well-developed and well-nourished. No distress.  HENT:  Head: Normocephalic and atraumatic.  Eyes: EOM are normal. Pupils are equal, round, and reactive to light.  Neck: Neck supple. No thyromegaly present.  Cardiovascular: Normal rate, regular rhythm and normal heart sounds.   No murmur heard. Pulmonary/Chest: Effort normal and breath sounds normal. No respiratory distress. She has no wheezes.  Musculoskeletal: Normal range of motion. She exhibits no edema.  Lymphadenopathy:    She has no cervical adenopathy.  Neurological: She is alert and oriented to person, place, and time.  Skin: Skin is warm and dry.  Psychiatric: She has a normal mood and affect. Her behavior is normal.    Filed Vitals:   10/05/13 1418  BP: 88/60  Pulse: 114  Temp: 98.6 F (37 C)  TempSrc: Oral  Resp: 16  Height: 5' 5.5" (1.664 m)  Weight: 162 lb 12.8 oz (73.846 kg)  SpO2: 98%   Wt Readings from Last 3 Encounters:  10/05/13 162 lb 12.8 oz (73.846 kg)  10/22/12 154 lb 12.8 oz (70.217 kg)  09/07/12 155 lb (70.308 kg)         Assessment & Plan:    I have completed the patient encounter in its entirety as documented  by the scribe, with editing by me where necessary. Robert P. Merla Richesoolittle, M.D. Unspecified hypothyroidism - Plan: levothyroxine (SYNTHROID, LEVOTHROID) 75 MCG tablet, CBC, TSH, T3, Free, T4, Free, COMPLETE METABOLIC PANEL WITH GFR, Iron and TIBC  Fatigue - Plan: levothyroxine (SYNTHROID, LEVOTHROID) 75 MCG tablet, CBC, TSH, T3, Free, T4, Free, COMPLETE METABOLIC PANEL WITH GFR, Iron and TIBC  Non-restorative sleep - Plan: levothyroxine (SYNTHROID, LEVOTHROID) 75 MCG tablet, CBC, TSH, T3, Free, T4, Free, COMPLETE METABOLIC PANEL WITH GFR, Iron and  TIBC  Call w/ labs/rx   Addend2/27 Results for orders placed in visit on 10/05/13  CBC      Result Value Ref Range   WBC 6.1  4.0 - 10.5 K/uL   RBC 4.77  3.87 - 5.11 MIL/uL   Hemoglobin 12.1  12.0 - 15.0 g/dL   HCT 16.137.0  09.636.0 - 04.546.0 %   MCV 77.6 (*) 78.0 - 100.0 fL   MCH 25.4 (*) 26.0 - 34.0 pg   MCHC 32.7  30.0 - 36.0 g/dL   RDW 40.916.2 (*) 81.111.5 - 91.415.5 %   Platelets 359  150 - 400 K/uL  TSH      Result Value Ref Range   TSH 0.983  0.350 - 4.500 uIU/mL  T3, FREE      Result Value Ref Range   T3, Free 3.2  2.3 - 4.2 pg/mL  T4, FREE      Result Value Ref Range   Free T4 1.42  0.80 - 1.80 ng/dL  COMPLETE METABOLIC PANEL WITH GFR      Result Value Ref Range   Sodium 140  135 - 145 mEq/L   Potassium 4.2  3.5 - 5.3 mEq/L   Chloride 103  96 - 112 mEq/L   CO2 26  19 - 32 mEq/L   Glucose, Bld 95  70 - 99 mg/dL   BUN 19  6 - 23 mg/dL   Creat 7.820.93  9.560.50 - 2.131.10 mg/dL   Total Bilirubin 0.3  0.2 - 1.2 mg/dL   Alkaline Phosphatase 48  39 - 117 U/L   AST 16  0 - 37 U/L   ALT 14  0 - 35 U/L   Total Protein 7.1  6.0 - 8.3 g/dL   Albumin 4.3  3.5 - 5.2 g/dL   Calcium 9.7  8.4 - 08.610.5 mg/dL   GFR, Est African American 85     GFR, Est Non African American 73    IRON AND TIBC      Result Value Ref Range   Iron 34 (*) 42 - 145 ug/dL   UIBC 578405 (*) 469125 - 629400 ug/dL   TIBC 528439  413250 - 244470 ug/dL   %SAT 8 (*) 20 - 55 %

## 2013-10-06 LAB — COMPLETE METABOLIC PANEL WITH GFR
ALK PHOS: 48 U/L (ref 39–117)
ALT: 14 U/L (ref 0–35)
AST: 16 U/L (ref 0–37)
Albumin: 4.3 g/dL (ref 3.5–5.2)
BILIRUBIN TOTAL: 0.3 mg/dL (ref 0.2–1.2)
BUN: 19 mg/dL (ref 6–23)
CO2: 26 mEq/L (ref 19–32)
CREATININE: 0.93 mg/dL (ref 0.50–1.10)
Calcium: 9.7 mg/dL (ref 8.4–10.5)
Chloride: 103 mEq/L (ref 96–112)
GFR, Est African American: 85 mL/min
GFR, Est Non African American: 73 mL/min
Glucose, Bld: 95 mg/dL (ref 70–99)
Potassium: 4.2 mEq/L (ref 3.5–5.3)
Sodium: 140 mEq/L (ref 135–145)
TOTAL PROTEIN: 7.1 g/dL (ref 6.0–8.3)

## 2013-10-06 LAB — IRON AND TIBC
%SAT: 8 % — ABNORMAL LOW (ref 20–55)
Iron: 34 ug/dL — ABNORMAL LOW (ref 42–145)
TIBC: 439 ug/dL (ref 250–470)
UIBC: 405 ug/dL — AB (ref 125–400)

## 2013-10-06 LAB — TSH: TSH: 0.983 u[IU]/mL (ref 0.350–4.500)

## 2013-10-06 LAB — T4, FREE: FREE T4: 1.42 ng/dL (ref 0.80–1.80)

## 2013-10-06 LAB — T3, FREE: T3, Free: 3.2 pg/mL (ref 2.3–4.2)

## 2013-10-07 ENCOUNTER — Telehealth: Payer: Self-pay | Admitting: Physician Assistant

## 2013-10-07 MED ORDER — LEVOTHYROXINE SODIUM 75 MCG PO TABS: 75.0000 ug | ORAL_TABLET | Freq: Every day | ORAL | Status: DC

## 2013-10-07 NOTE — Addendum Note (Signed)
Addended by: Fernande BrasJEFFERY, Leeanna Slaby S on: 10/07/2013 11:50 AM   Modules accepted: Orders

## 2013-10-08 LAB — IFOBT (OCCULT BLOOD): IFOBT: NEGATIVE

## 2013-10-08 NOTE — Addendum Note (Signed)
Addended by: Gerrianne ScalePAYNE, Kirin Brandenburger L on: 10/08/2013 09:57 AM   Modules accepted: Orders

## 2013-10-23 NOTE — Telephone Encounter (Signed)
Left message

## 2013-11-30 ENCOUNTER — Ambulatory Visit (INDEPENDENT_AMBULATORY_CARE_PROVIDER_SITE_OTHER): Payer: BC Managed Care – PPO | Admitting: Internal Medicine

## 2013-11-30 ENCOUNTER — Encounter: Payer: Self-pay | Admitting: Internal Medicine

## 2013-11-30 VITALS — BP 99/63 | HR 62 | Temp 97.9°F | Resp 16 | Ht 65.5 in | Wt 165.0 lb

## 2013-11-30 DIAGNOSIS — D509 Iron deficiency anemia, unspecified: Secondary | ICD-10-CM

## 2013-11-30 LAB — IRON AND TIBC
%SAT: 34 % (ref 20–55)
IRON: 126 ug/dL (ref 42–145)
TIBC: 366 ug/dL (ref 250–470)
UIBC: 240 ug/dL (ref 125–400)

## 2013-11-30 LAB — CBC WITH DIFFERENTIAL/PLATELET
Basophils Absolute: 0.1 10*3/uL (ref 0.0–0.1)
Basophils Relative: 1 % (ref 0–1)
EOS ABS: 0.1 10*3/uL (ref 0.0–0.7)
Eosinophils Relative: 2 % (ref 0–5)
HCT: 42.7 % (ref 36.0–46.0)
HEMOGLOBIN: 14.6 g/dL (ref 12.0–15.0)
LYMPHS PCT: 26 % (ref 12–46)
Lymphs Abs: 1.6 10*3/uL (ref 0.7–4.0)
MCH: 28.5 pg (ref 26.0–34.0)
MCHC: 34.2 g/dL (ref 30.0–36.0)
MCV: 83.2 fL (ref 78.0–100.0)
MONO ABS: 0.4 10*3/uL (ref 0.1–1.0)
MONOS PCT: 7 % (ref 3–12)
NEUTROS PCT: 64 % (ref 43–77)
Neutro Abs: 4 10*3/uL (ref 1.7–7.7)
Platelets: 313 10*3/uL (ref 150–400)
RBC: 5.13 MIL/uL — ABNORMAL HIGH (ref 3.87–5.11)
RDW: 19.3 % — AB (ref 11.5–15.5)
WBC: 6.3 10*3/uL (ref 4.0–10.5)

## 2013-11-30 LAB — FERRITIN: Ferritin: 29 ng/mL (ref 10–291)

## 2013-11-30 NOTE — Progress Notes (Signed)
   This chart was scribed for Tonye Pearsonobert P Tobechukwu Emmick, MD by Ardelia Memsylan Malpass, ED Scribe. This patient was seen in room 24 and the patient's care was started at 12:29 PM.  Subjective:    Patient ID: Melissa Mosley, female    DOB: 10/13/1965, 48 y.o.   MRN: [id number]  HPI  HPI Comments: Melissa Mosley is a 48 y.o. female who presents to [facility] for a follow-up evaluation of anemia. She states that she has been less fatigued since her last visit. She states that she has some fatigue currently, but believes this is due to not sleeping well the past couple nights. She states that she has been taking an Iron caplet with each meal. She states that she has not had any GI symptoms or other side effects from taking Iron.   Patient Active Problem List   Diagnosis Date Noted  . Concussion 09/07/2012  . Contusion of shoulder, left 09/07/2012  . Hypothyroid 11/19/2011    Review of Systems [place]    Objective:   Physical Exam  Nursing note and vitals reviewed. Constitutional: She is oriented to person, place, and time. She appears well-developed and well-nourished. No distress.  HENT:  Head: Normocephalic and atraumatic.  Eyes: EOM are normal.  Neck: Neck supple.  Cardiovascular: Normal rate.   Pulmonary/Chest: Effort normal. No respiratory distress.  Musculoskeletal: Normal range of motion.  Neurological: She is alert and oriented to person, place, and time.  Skin: Skin is warm and dry.  Psychiatric: She has a normal mood and affect. Her behavior is normal.         BP 99/63  Pulse 62  Temp(Src) 97.9 F (36.6 C)  Resp 16  Ht 5' 5.5" (1.664 m)  Wt 165 lb (74.844 kg)  BMI 27.03 kg/m2  SpO2 100%  LMP 10/20/2013 Assessment & Plan:  Anemia, iron deficiency - Plan: CBC with Differential, Iron and TIBC, Ferritin  Recheck labs --cont fe 6 mos   I have completed the patient encounter in its entirety as documented by the scribe, with editing by me where necessary. Roberta Kelly P.  Merla Richesoolittle, M.D.

## 2013-12-04 ENCOUNTER — Encounter: Payer: Self-pay | Admitting: Internal Medicine

## 2014-11-10 ENCOUNTER — Ambulatory Visit (INDEPENDENT_AMBULATORY_CARE_PROVIDER_SITE_OTHER): Payer: BLUE CROSS/BLUE SHIELD | Admitting: Internal Medicine

## 2014-11-10 ENCOUNTER — Other Ambulatory Visit: Payer: Self-pay | Admitting: Internal Medicine

## 2014-11-10 VITALS — BP 90/60 | HR 63 | Temp 97.9°F | Resp 16 | Ht 65.75 in | Wt 158.2 lb

## 2014-11-10 DIAGNOSIS — E038 Other specified hypothyroidism: Secondary | ICD-10-CM | POA: Diagnosis not present

## 2014-11-10 LAB — LIPID PANEL
CHOLESTEROL: 164 mg/dL (ref 0–200)
HDL: 54 mg/dL (ref >=46)
LDL Cholesterol: 93 mg/dL (ref 0–99)
Total CHOL/HDL Ratio: 3 Ratio
Triglycerides: 87 mg/dL (ref <150)
VLDL: 17 mg/dL (ref 0–40)

## 2014-11-10 LAB — CBC WITH DIFFERENTIAL/PLATELET
BASOS PCT: 0 % (ref 0–1)
Basophils Absolute: 0 10*3/uL (ref 0.0–0.1)
EOS ABS: 0.1 10*3/uL (ref 0.0–0.7)
EOS PCT: 2 % (ref 0–5)
HCT: 42.1 % (ref 36.0–46.0)
Hemoglobin: 14.3 g/dL (ref 12.0–15.0)
Lymphocytes Relative: 23 % (ref 12–46)
Lymphs Abs: 1.3 10*3/uL (ref 0.7–4.0)
MCH: 30.6 pg (ref 26.0–34.0)
MCHC: 34 g/dL (ref 30.0–36.0)
MCV: 90 fL (ref 78.0–100.0)
MPV: 9.2 fL (ref 8.6–12.4)
Monocytes Absolute: 0.5 10*3/uL (ref 0.1–1.0)
Monocytes Relative: 8 % (ref 3–12)
NEUTROS PCT: 67 % (ref 43–77)
Neutro Abs: 3.8 10*3/uL (ref 1.7–7.7)
Platelets: 310 10*3/uL (ref 150–400)
RBC: 4.68 MIL/uL (ref 3.87–5.11)
RDW: 13 % (ref 11.5–15.5)
WBC: 5.7 10*3/uL (ref 4.0–10.5)

## 2014-11-10 LAB — COMPREHENSIVE METABOLIC PANEL
ALBUMIN: 4.2 g/dL (ref 3.5–5.2)
ALT: 13 U/L (ref 0–35)
AST: 15 U/L (ref 0–37)
Alkaline Phosphatase: 40 U/L (ref 39–117)
BUN: 16 mg/dL (ref 6–23)
CALCIUM: 9.4 mg/dL (ref 8.4–10.5)
CHLORIDE: 104 meq/L (ref 96–112)
CO2: 25 meq/L (ref 19–32)
CREATININE: 0.83 mg/dL (ref 0.50–1.10)
Glucose, Bld: 76 mg/dL (ref 70–99)
POTASSIUM: 4.5 meq/L (ref 3.5–5.3)
Sodium: 141 mEq/L (ref 135–145)
Total Bilirubin: 0.5 mg/dL (ref 0.2–1.2)
Total Protein: 6.9 g/dL (ref 6.0–8.3)

## 2014-11-10 LAB — TSH: TSH: 2.473 u[IU]/mL (ref 0.350–4.500)

## 2014-11-10 LAB — T4, FREE: Free T4: 1.34 ng/dL (ref 0.80–1.80)

## 2014-11-10 MED ORDER — THYROID 30 MG PO TABS: 30.0000 mg | ORAL_TABLET | Freq: Every day | ORAL | Status: DC

## 2014-11-10 MED ORDER — THYROID 60 MG PO TABS: 60.0000 mg | ORAL_TABLET | Freq: Every day | ORAL | Status: DC

## 2014-11-10 NOTE — Addendum Note (Signed)
Addended by: Tonye PearsonOLITTLE, Sarra Rachels P on: 11/10/2014 05:45 PM   Modules accepted: Orders

## 2014-11-10 NOTE — Progress Notes (Addendum)
Subjective:  This chart was scribed for Dr. Tami Lin, MD , MD by Chester Holstein, ED Scribe. This patient was seen in room 10 and the patient's care was started at 8:20 AM.    Patient ID: Melissa Mosley, female    DOB: 1966-04-09, 49 y.o.   MRN: [id number]  HPI Chief Complaint  Patient presents with  . Hypothyroidism    to check  . Medication Refill    need synthroid , and if is possible to change to Armour    HPI Comments: Melissa Mosley is a 49 y.o. female who presents to the [facility] for medication change. Pt with h/o of hypothyroidism. She reports she is concerned the synthroid she is currently taking is affecting her joints. She has been taking synthroid every other day per suggestion by Dr. Joseph Art. She would like to change to Armour.  She has not felt fatigued and her Fe levels have been normal since her last visit in 11/2013. Pt denies SOB and paroxysmal nocturnal dyspnea.   Past Medical History  Diagnosis Date  . Thyroid disease    No past surgical history on file.   Family History  Problem Relation Age of Onset  . Arthritis Mother   . Hypertension Mother   . Arthritis Father   . Hypertension Father   . Thyroid disease Sister   . Arthritis Maternal Grandmother   . Hypertension Maternal Grandfather   . Stroke Paternal Grandmother   . Hypertension Paternal Grandfather     History   Social History  . Marital Status: Married    Spouse Name: N/A  . Number of Children: N/A  . Years of Education: N/A   Occupational History  . Not on file.   Social History Main Topics  . Smoking status: Never Smoker   . Smokeless tobacco: Never Used  . Alcohol Use: No  . Drug Use: No  . Sexual Activity: Yes    Birth Control/ Protection: Surgical   Other Topics Concern  . Not on file   Social History Narrative    Allergies  Allergen Reactions  . Avelox [Moxifloxacin Hcl In Nacl] Rash     Current outpatient prescriptions:  .  ferrous  fumarate (HEMOCYTE - 106 MG FE) 325 (106 FE) MG TABS tablet, Take 1 tablet by mouth 3 (three) times daily., Disp: , Rfl:  .  levothyroxine (SYNTHROID, LEVOTHROID) 75 MCG tablet, Take 1 tablet (75 mcg total) by mouth daily., Disp: 90 tablet, Rfl: 3   Review of Systems  Constitutional: Negative for fatigue.  Respiratory: Negative for shortness of breath.        Objective:   Physical Exam  Constitutional: She is oriented to person, place, and time. She appears well-developed and well-nourished.  HENT:  Head: Normocephalic.  Eyes: Conjunctivae are normal.  Neck: Normal range of motion. Neck supple. No thyromegaly present.  Pulmonary/Chest: Effort normal.  Musculoskeletal: Normal range of motion.  Neurological: She is alert and oriented to person, place, and time.  Skin: Skin is warm and dry.  Psychiatric: She has a normal mood and affect. Her behavior is normal.  Nursing note and vitals reviewed.     Filed Vitals:   11/10/14 0815  BP: 90/60  Pulse: 63  Temp: 97.9 F (36.6 C)  Resp: 16    Assessment & Plan:    Other specified hypothyroidism - Plan: CBC with Differential/Platelet, Comprehensive metabolic panel, Lipid panel, TSH, T4, free  Results for orders placed or performed in  visit on 11/10/14  CBC with Differential/Platelet  Result Value Ref Range   WBC 5.7 4.0 - 10.5 K/uL   RBC 4.68 3.87 - 5.11 MIL/uL   Hemoglobin 14.3 12.0 - 15.0 g/dL   HCT 42.1 36.0 - 46.0 %   MCV 90.0 78.0 - 100.0 fL   MCH 30.6 26.0 - 34.0 pg   MCHC 34.0 30.0 - 36.0 g/dL   RDW 13.0 11.5 - 15.5 %   Platelets 310 150 - 400 K/uL   MPV 9.2 8.6 - 12.4 fL   Neutrophils Relative % 67 43 - 77 %   Neutro Abs 3.8 1.7 - 7.7 K/uL   Lymphocytes Relative 23 12 - 46 %   Lymphs Abs 1.3 0.7 - 4.0 K/uL   Monocytes Relative 8 3 - 12 %   Monocytes Absolute 0.5 0.1 - 1.0 K/uL   Eosinophils Relative 2 0 - 5 %   Eosinophils Absolute 0.1 0.0 - 0.7 K/uL   Basophils Relative 0 0 - 1 %   Basophils Absolute 0.0 0.0 -  0.1 K/uL   Smear Review Criteria for review not met   Comprehensive metabolic panel  Result Value Ref Range   Sodium 141 135 - 145 mEq/L   Potassium 4.5 3.5 - 5.3 mEq/L   Chloride 104 96 - 112 mEq/L   CO2 25 19 - 32 mEq/L   Glucose, Bld 76 70 - 99 mg/dL   BUN 16 6 - 23 mg/dL   Creat 0.83 0.50 - 1.10 mg/dL   Total Bilirubin 0.5 0.2 - 1.2 mg/dL   Alkaline Phosphatase 40 39 - 117 U/L   AST 15 0 - 37 U/L   ALT 13 0 - 35 U/L   Total Protein 6.9 6.0 - 8.3 g/dL   Albumin 4.2 3.5 - 5.2 g/dL   Calcium 9.4 8.4 - 10.5 mg/dL  Lipid panel  Result Value Ref Range   Cholesterol 164 0 - 200 mg/dL   Triglycerides 87 <150 mg/dL   HDL 54 >=46 mg/dL   Total CHOL/HDL Ratio 3.0 Ratio   VLDL 17 0 - 40 mg/dL   LDL Cholesterol 93 0 - 99 mg/dL  TSH  Result Value Ref Range   TSH 2.473 0.350 - 4.500 uIU/mL  T4, free  Result Value Ref Range   Free T4 1.34 0.80 - 1.80 ng/dL     closet to recent dosing of 1/2 x 75 levo is 30 mg armour #90 #ref printed       I have completed the patient encounter in its entirety as documented by the scribe, with editing by me where necessary. Keyan Folson P. Laney Pastor, M.D.

## 2014-11-13 NOTE — Progress Notes (Signed)
Yes--i accounted for the 1/2 dose synthr when calculating armour

## 2014-11-28 ENCOUNTER — Telehealth: Payer: Self-pay

## 2014-11-28 ENCOUNTER — Telehealth: Payer: Self-pay | Admitting: Physician Assistant

## 2014-11-28 MED ORDER — OSELTAMIVIR PHOSPHATE 75 MG PO CAPS: 75.0000 mg | ORAL_CAPSULE | Freq: Two times a day (BID) | ORAL | Status: DC

## 2014-11-28 MED ORDER — AZITHROMYCIN 250 MG PO TABS: ORAL_TABLET | ORAL | Status: DC

## 2014-11-28 NOTE — Telephone Encounter (Signed)
Pt states her son was diagnosed with strep and she think she may be coming down with flu like symptoms and would like to have some TAMIFLU called in Please call pt at [phone]      [facility]

## 2014-11-28 NOTE — Telephone Encounter (Signed)
Left vmail for patient to call back to clarify sx and events surrounding illness.

## 2014-11-28 NOTE — Telephone Encounter (Signed)
Patient returning call. Confirms son was diagnosed with strep and flu 5 days ago and she is now feeling fatigued and has some post nasal drip since yesterday. Request rx sent for pharmacy. Will send tamiflu and azithromycin. Should see if sx improve with tamiflu first before filling azithromycin. Can take ibuprofen for fever any pain sx.

## 2014-11-28 NOTE — Telephone Encounter (Signed)
Pt's son was diagnosed with strep and flu A. Pt states she is now having throat pain, no energy, fever, cough. Wants to know if she can be rx'd tamiflu. I informed pt since she is already symptomatic that we may advise her to come in and be seen. Please advise.

## 2015-04-04 ENCOUNTER — Other Ambulatory Visit: Payer: Self-pay | Admitting: Physician Assistant

## 2015-04-04 ENCOUNTER — Ambulatory Visit (INDEPENDENT_AMBULATORY_CARE_PROVIDER_SITE_OTHER): Payer: BLUE CROSS/BLUE SHIELD | Admitting: Physician Assistant

## 2015-04-04 VITALS — BP 100/60 | HR 55 | Temp 98.7°F | Resp 16 | Ht 66.0 in | Wt 153.0 lb

## 2015-04-04 DIAGNOSIS — E038 Other specified hypothyroidism: Secondary | ICD-10-CM | POA: Diagnosis not present

## 2015-04-04 DIAGNOSIS — D509 Iron deficiency anemia, unspecified: Secondary | ICD-10-CM | POA: Diagnosis not present

## 2015-04-04 NOTE — Patient Instructions (Signed)
We are checking your TSH today and will let you know the results.   Hypothyroidism The thyroid is a large gland located in the lower front of your neck. The thyroid gland helps control metabolism. Metabolism is how your body handles food. It controls metabolism with the hormone thyroxine. When this gland is underactive (hypothyroid), it produces too little hormone.  CAUSES These include:   Absence or destruction of thyroid tissue.  Goiter due to iodine deficiency.  Goiter due to medications.  Congenital defects (since birth).  Problems with the pituitary. This causes a lack of TSH (thyroid stimulating hormone). This hormone tells the thyroid to turn out more hormone. SYMPTOMS  Lethargy (feeling as though you have no energy)  Cold intolerance  Weight gain (in spite of normal food intake)  Dry skin  Coarse hair  Menstrual irregularity (if severe, may lead to infertility)  Slowing of thought processes Cardiac problems are also caused by insufficient amounts of thyroid hormone. Hypothyroidism in the newborn is cretinism, and is an extreme form. It is important that this form be treated adequately and immediately or it will lead rapidly to retarded physical and mental development. DIAGNOSIS  To prove hypothyroidism, your caregiver may do blood tests and ultrasound tests. Sometimes the signs are hidden. It may be necessary for your caregiver to watch this illness with blood tests either before or after diagnosis and treatment. TREATMENT  Low levels of thyroid hormone are increased by using synthetic thyroid hormone. This is a safe, effective treatment. It usually takes about four weeks to gain the full effects of the medication. After you have the full effect of the medication, it will generally take another four weeks for problems to leave. Your caregiver may start you on low doses. If you have had heart problems the dose may be gradually increased. It is generally not an emergency to  get rapidly to normal. HOME CARE INSTRUCTIONS   Take your medications as your caregiver suggests. Let your caregiver know of any medications you are taking or start taking. Your caregiver will help you with dosage schedules.  As your condition improves, your dosage needs may increase. It will be necessary to have continuing blood tests as suggested by your caregiver.  Report all suspected medication side effects to your caregiver. SEEK MEDICAL CARE IF: Seek medical care if you develop:  Sweating.  Tremulousness (tremors).  Anxiety.  Rapid weight loss.  Heat intolerance.  Emotional swings.  Diarrhea.  Weakness. SEEK IMMEDIATE MEDICAL CARE IF:  You develop chest pain, an irregular heart beat (palpitations), or a rapid heart beat. MAKE SURE YOU:   Understand these instructions.  Will watch your condition.  Will get help right away if you are not doing well or get worse. Document Released: 07/28/2005 Document Revised: 10/20/2011 Document Reviewed: 03/17/2008 [facility] Patient Information 2015 [place], [place]. This information is not intended to replace advice given to you by your health care provider. Make sure you discuss any questions you have with your health care provider.

## 2015-04-04 NOTE — Progress Notes (Signed)
   Subjective:    Patient ID: Melissa Mosley, female    DOB: Apr 07, 1966, 49 y.o.   MRN: [id number]  Chief Complaint  Patient presents with  . Hyperthyroidism    follow up   Medications, allergies, past medical history, surgical history, family history, social history and problem list reviewed and updated.  HPI  17 yof presents wanting thyroid f/u.  Hx hypothyroidism. Used to take synthroid but was started on armour 5 months ago as she no longer wished to take synthroid and states was having SEs with it. She took 30 mcg armour qd for 2 months then for past 3 months has been taking 30 mcg every other day. Has been taking in the am on empty stomach.   She is here as her friend told her she has been acting flaky and busy lately so wants to get thyroid checked. Denies palps, fatigue, trouble sleeping, constipation, edema, dry skin, myalgias. She is down 5# from 5 months ago.   States her iron is normal had checked elsewhere 3 months ago.   Review of Systems See HPI     Objective:   Physical Exam  Constitutional: She is oriented to person, place, and time.  BP 100/60 mmHg  Pulse 55  Temp(Src) 98.7 F (37.1 C) (Oral)  Resp 16  Ht  (1.676 m)  Wt 153 lb (69.4 kg)  BMI 24.71 kg/m2  SpO2 95%  LMP 03/16/2015   Cardiovascular: Normal rate, regular rhythm and normal heart sounds.   Pulmonary/Chest: Effort normal and breath sounds normal.  Neurological: She is alert and oriented to person, place, and time.  Psychiatric: She has a normal mood and affect. Her speech is normal and behavior is normal.      Assessment & Plan:   Other specified hypothyroidism - Plan: TSH --checking tsh today, adding t4 pending result --discussed option of going back on synthroid instead of armour but wants to stay on armour at this time  Anemia, iron deficiency --per pt under control at this time --not taking iron supplement any more  Donnajean Lopes, PA-C Physician Assistant-Certified [facility]  04/04/2015 7:22 PM

## 2015-04-05 LAB — TSH: TSH: 6.522 u[IU]/mL — ABNORMAL HIGH (ref 0.350–4.500)

## 2015-04-07 LAB — T4, FREE: Free T4: 0.93 ng/dL (ref 0.80–1.80)

## 2015-04-08 ENCOUNTER — Telehealth: Payer: Self-pay | Admitting: Physician Assistant

## 2015-04-08 NOTE — Telephone Encounter (Signed)
Attempted to contact pt re thyroid results. Msg left.

## 2015-04-10 ENCOUNTER — Telehealth: Payer: Self-pay

## 2015-04-10 NOTE — Telephone Encounter (Signed)
Pt returned your phone call regarding thyroid results. I informed her of her elevated TSH. What would you like [place] to tell her regarding any changes to medication?

## 2015-04-10 NOTE — Telephone Encounter (Signed)
Please let her know she was very mildly hypothyroid. I discussed this with Dr Merla Riches and she has several options. She can continue the 30 mcg dose every other day, and take 15 mcg on the days in between. She could also take 30 two days in a row then take a day off and continue like that. Whatever will work best for her. We would like to recheck in about 3 months. Thanks.

## 2015-04-11 NOTE — Telephone Encounter (Signed)
Spoke with pt, advised message from [place].

## 2015-10-02 ENCOUNTER — Ambulatory Visit (INDEPENDENT_AMBULATORY_CARE_PROVIDER_SITE_OTHER): Payer: BLUE CROSS/BLUE SHIELD | Admitting: Family Medicine

## 2015-10-02 ENCOUNTER — Encounter: Payer: Self-pay | Admitting: Family Medicine

## 2015-10-02 VITALS — BP 90/52 | HR 71 | Temp 98.4°F | Resp 16 | Ht 65.75 in | Wt 155.8 lb

## 2015-10-02 DIAGNOSIS — E034 Atrophy of thyroid (acquired): Secondary | ICD-10-CM | POA: Diagnosis not present

## 2015-10-02 DIAGNOSIS — E038 Other specified hypothyroidism: Secondary | ICD-10-CM

## 2015-10-02 NOTE — Progress Notes (Signed)
   Subjective:    Patient ID: Melissa Mosley, female    DOB: 1965-11-10, 50 y.o.   MRN: [id number]  HPI This is a 50 yo female who presents today for follow up of hypothyroidism. She had thyroid panel checked 06/15/15 at [facility] ob/gyn. T4, T3 uptake and free T3 normal. TSH was 5.29. She has not been on any thyroid supplement in 4 months (takes Armour).   Sees gyn annually. Has mammo q3 years.  Past Medical History  Diagnosis Date  . Thyroid disease    No past surgical history on file. Family History  Problem Relation Age of Onset  . Arthritis Mother   . Hypertension Mother   . Arthritis Father   . Hypertension Father   . Thyroid disease Sister   . Arthritis Maternal Grandmother   . Hypertension Maternal Grandfather   . Stroke Paternal Grandmother   . Hypertension Paternal Grandfather    Social History  Substance Use Topics  . Smoking status: Never Smoker   . Smokeless tobacco: Never Used  . Alcohol Use: No    Review of Systems  Constitutional: Negative for fatigue and unexpected weight change.  Cardiovascular: Negative for palpitations.  Gastrointestinal: Negative for diarrhea and constipation.       Objective:   Physical Exam Physical Exam  Vitals reviewed. Constitutional: Oriented to person, place, and time. Appears well-developed and well-nourished.  HENT:  Head: Normocephalic and atraumatic.  Eyes: Conjunctivae are normal.  Neck: Normal range of motion. Neck supple.  Cardiovascular: Normal rate.   Pulmonary/Chest: Effort normal.  Musculoskeletal: Normal range of motion.  Neurological: Alert and oriented to person, place, and time.  Skin: Skin is warm and dry.  Psychiatric: Normal mood and affect. Behavior is normal. Judgment and thought content normal.   BP 90/52 mmHg  Pulse 71  Temp(Src) 98.4 F (36.9 C) (Oral)  Resp 16  Ht 5' 5.75" (1.67 m)  Wt 155 lb 12.8 oz (70.67 kg)  BMI 25.34 kg/m2  SpO2 97%  LMP 09/30/2015 Wt Readings from Last 3  Encounters:  10/02/15 155 lb 12.8 oz (70.67 kg)  04/04/15 153 lb (69.4 kg)  11/10/14 158 lb 3.2 oz (71.759 kg)      Assessment & Plan:  1. Hypothyroidism due to acquired atrophy of thyroid - Will recheck TSH since she has not been on any treatment in 5-6 months to determine need for replacement/dosing - TSH   Olean Ree, FNP-BC  [facility]  10/02/2015 11:57 AM

## 2015-10-02 NOTE — Patient Instructions (Signed)
I will call you about your TSH results

## 2015-10-03 LAB — TSH: TSH: 5.3 mIU/L — ABNORMAL HIGH

## 2016-05-12 DIAGNOSIS — Z6825 Body mass index (BMI) 25.0-25.9, adult: Secondary | ICD-10-CM | POA: Diagnosis not present

## 2016-05-12 DIAGNOSIS — Z124 Encounter for screening for malignant neoplasm of cervix: Secondary | ICD-10-CM | POA: Diagnosis not present

## 2016-05-12 DIAGNOSIS — R87610 Atypical squamous cells of undetermined significance on cytologic smear of cervix (ASC-US): Secondary | ICD-10-CM | POA: Diagnosis not present

## 2016-05-12 DIAGNOSIS — R946 Abnormal results of thyroid function studies: Secondary | ICD-10-CM | POA: Diagnosis not present

## 2016-05-12 DIAGNOSIS — Z01419 Encounter for gynecological examination (general) (routine) without abnormal findings: Secondary | ICD-10-CM | POA: Diagnosis not present

## 2017-05-21 DIAGNOSIS — E039 Hypothyroidism, unspecified: Secondary | ICD-10-CM | POA: Diagnosis not present

## 2017-05-21 DIAGNOSIS — Z6825 Body mass index (BMI) 25.0-25.9, adult: Secondary | ICD-10-CM | POA: Diagnosis not present

## 2017-05-21 DIAGNOSIS — Z01419 Encounter for gynecological examination (general) (routine) without abnormal findings: Secondary | ICD-10-CM | POA: Diagnosis not present

## 2017-10-12 ENCOUNTER — Telehealth: Payer: Self-pay | Admitting: General Practice

## 2017-10-12 NOTE — Telephone Encounter (Signed)
Copied from CRM [id number]. Topic: Inquiry >> Oct 12, 2017 10:32 AM Windy KalataMichael, Didi Ganaway L, NT wrote: Patient was seen here by Deboraha Sprangebbie Gessner before she left. Patient is needing to get her thyroid levels checked. She has a appt with [facility] on 10/21/17 at 3:20. She would like the orders placed to come to the lab and get a full thyroid panel before her appt so they can be discussed during the appt. Please contact patient when these orders are placed. Thank you.

## 2017-10-13 ENCOUNTER — Other Ambulatory Visit: Payer: Self-pay | Admitting: Physician Assistant

## 2017-10-13 DIAGNOSIS — E079 Disorder of thyroid, unspecified: Secondary | ICD-10-CM

## 2017-10-13 NOTE — Telephone Encounter (Signed)
Please call pt and let her know she can come in for thyroid labs at any time. Thank you

## 2017-10-14 NOTE — Telephone Encounter (Signed)
Please inform patient she may come in for Thyroid labs anytime before her scheduled appointment.   It would be a lab visit only.

## 2017-10-14 NOTE — Telephone Encounter (Signed)
Pt returned call. Made her aware of message left in chart. Pt will come in to have labs completed.

## 2017-10-16 ENCOUNTER — Ambulatory Visit: Payer: BLUE CROSS/BLUE SHIELD

## 2017-10-16 DIAGNOSIS — E079 Disorder of thyroid, unspecified: Secondary | ICD-10-CM | POA: Diagnosis not present

## 2017-10-17 LAB — THYROID PANEL WITH TSH
Free Thyroxine Index: 1.6 (ref 1.2–4.9)
T3 Uptake Ratio: 26 % (ref 24–39)
T4, Total: 6.3 ug/dL (ref 4.5–12.0)
TSH: 5.44 u[IU]/mL — ABNORMAL HIGH (ref 0.450–4.500)

## 2017-10-21 ENCOUNTER — Other Ambulatory Visit: Payer: Self-pay

## 2017-10-21 ENCOUNTER — Ambulatory Visit (INDEPENDENT_AMBULATORY_CARE_PROVIDER_SITE_OTHER): Payer: BLUE CROSS/BLUE SHIELD | Admitting: Physician Assistant

## 2017-10-21 ENCOUNTER — Encounter: Payer: Self-pay | Admitting: Physician Assistant

## 2017-10-21 VITALS — BP 104/51 | HR 69 | Temp 98.4°F | Resp 16 | Ht 65.0 in | Wt 169.4 lb

## 2017-10-21 DIAGNOSIS — E038 Other specified hypothyroidism: Secondary | ICD-10-CM

## 2017-10-21 MED ORDER — THYROID 30 MG PO TABS: 30.0000 mg | ORAL_TABLET | Freq: Every day | ORAL | 3 refills | Status: DC

## 2017-10-21 NOTE — Patient Instructions (Signed)
     IF you received an x-ray today, you will receive an invoice from [facility]. Please contact [facility] at [phone] with questions or concerns regarding your invoice.   IF you received labwork today, you will receive an invoice from [facility]. Please contact [facility] at [phone] with questions or concerns regarding your invoice.   Our billing staff will not be able to assist you with questions regarding bills from these companies.  You will be contacted with the lab results as soon as they are available. The fastest way to get your results is to activate your My Chart account. Instructions are located on the last page of this paperwork. If you have not heard from us regarding the results in 2 weeks, please contact this office.     

## 2017-10-21 NOTE — Progress Notes (Signed)
   Melissa MoraleLeslie Mosley  MRN: [id number] DOB: 12/11/1965  PCP: Nigel BridgemanLatham, Vicki, CNM  Subjective:  Pt is a 52 year old female who presents to clinic for hypothyroidism. She came in for lab only visit last week for TSH which was 5.44. She has had several elevated readings x 2 years. She started taking Armour 30mg  every other day a few months ago after an elevated TSH reading with her GYN Nigel BridgemanVicki Latham.  She is asymptomatic today.  Admits to increased life stressors x 1 year - mother is dying, coming out of financial struggle with husband, knee pain preventing her from exercising.   Review of Systems  Constitutional: Negative for activity change, diaphoresis, fatigue and unexpected weight change.  Cardiovascular: Negative for palpitations.  Gastrointestinal: Negative for constipation and diarrhea.  Endocrine: Negative for cold intolerance and heat intolerance.  Skin: Negative.   Neurological: Negative for dizziness and headaches.  Psychiatric/Behavioral: Negative for agitation and dysphoric mood. The patient is not nervous/anxious.     Patient Active Problem List   Diagnosis Date Noted  . Anemia, iron deficiency 11/30/2013  . Concussion 09/07/2012  . Contusion of shoulder, left 09/07/2012  . Hypothyroid 11/19/2011    No current outpatient medications on file prior to visit.   No current facility-administered medications on file prior to visit.     Allergies  Allergen Reactions  . Avelox [Moxifloxacin Hcl In Nacl] Rash     Objective:  BP (!) 104/51   Pulse 69   Temp 98.4 F (36.9 C) (Oral)   Resp 16   Ht 5\' 5"  (1.651 m)   Wt 169 lb 6.4 oz (76.8 kg)   LMP 10/11/2017   SpO2 100%   BMI 28.19 kg/m   Physical Exam  Constitutional: She is oriented to person, place, and time and well-developed, well-nourished, and in no distress. No distress.  Cardiovascular: Normal rate, regular rhythm and normal heart sounds.  Neurological: She is alert and oriented to person, place, and time. GCS  score is 15.  Skin: Skin is warm and dry.  Psychiatric: Mood, memory, affect and judgment normal.  Vitals reviewed.  Lab Results  Component Value Date   TSH 5.440 (H) 10/16/2017    Assessment and Plan :  1. Other specified hypothyroidism - TSH; Future - thyroid (ARMOUR) 30 MG tablet; Take 1 tablet (30 mg total) by mouth daily before breakfast.  Dispense: 90 tablet; Refill: 3 - Pt presents for f/u hypothyroidism. She is asymptomatic today. She has had several elevated TSH readings over the past year or so. Start Armour 30mg  qd. rtc for lab only TSH check in 4-6 weeks. Will contact with results and plan.    Marco CollieWhitney Heinrich Fertig, PA-C  [facility] at [facility] 10/21/2017 4:22 PM

## 2017-10-26 DIAGNOSIS — Z1231 Encounter for screening mammogram for malignant neoplasm of breast: Secondary | ICD-10-CM | POA: Diagnosis not present

## 2017-10-27 ENCOUNTER — Ambulatory Visit (INDEPENDENT_AMBULATORY_CARE_PROVIDER_SITE_OTHER): Payer: BLUE CROSS/BLUE SHIELD | Admitting: Physician Assistant

## 2017-10-27 ENCOUNTER — Encounter: Payer: Self-pay | Admitting: Physician Assistant

## 2017-10-27 ENCOUNTER — Other Ambulatory Visit: Payer: Self-pay

## 2017-10-27 VITALS — BP 124/70 | HR 62 | Temp 97.9°F | Resp 18 | Ht 65.0 in | Wt 170.0 lb

## 2017-10-27 DIAGNOSIS — Z130 Encounter for screening for diseases of the blood and blood-forming organs and certain disorders involving the immune mechanism: Secondary | ICD-10-CM

## 2017-10-27 DIAGNOSIS — Z13220 Encounter for screening for lipoid disorders: Secondary | ICD-10-CM

## 2017-10-27 DIAGNOSIS — Z13228 Encounter for screening for other metabolic disorders: Secondary | ICD-10-CM | POA: Diagnosis not present

## 2017-10-27 DIAGNOSIS — Z1321 Encounter for screening for nutritional disorder: Secondary | ICD-10-CM

## 2017-10-27 DIAGNOSIS — Z1329 Encounter for screening for other suspected endocrine disorder: Secondary | ICD-10-CM | POA: Diagnosis not present

## 2017-10-27 DIAGNOSIS — Z0000 Encounter for general adult medical examination without abnormal findings: Secondary | ICD-10-CM

## 2017-10-27 LAB — POCT URINALYSIS DIP (MANUAL ENTRY)
Bilirubin, UA: NEGATIVE
Blood, UA: NEGATIVE
Glucose, UA: NEGATIVE mg/dL
Ketones, POC UA: NEGATIVE mg/dL
Leukocytes, UA: NEGATIVE
Nitrite, UA: NEGATIVE
Protein Ur, POC: NEGATIVE mg/dL
Spec Grav, UA: 1.025 (ref 1.010–1.025)
Urobilinogen, UA: 0.2 U/dL
pH, UA: 7 (ref 5.0–8.0)

## 2017-10-27 NOTE — Patient Instructions (Signed)
Be sure to come back for lab only TSH check in a month or so.  We will contact you with the results of your lab work.  Let me know what you decide about your colonoscopy.   Thank you for coming in today. I hope you feel we met your needs.  Feel free to call PCP if you have any questions or further requests.  Please consider signing up for MyChart if you do not already have it, as this is a great way to communicate with me.  Best,  Whitney Harleen Fineberg, PA-C  Health Maintenance, Female Adopting a healthy lifestyle and getting preventive care can go a long way to promote health and wellness. Talk with your health care provider about what schedule of regular examinations is right for you. This is a good chance for you to check in with your provider about disease prevention and staying healthy. In between checkups, there are plenty of things you can do on your own. Experts have done a lot of research about which lifestyle changes and preventive measures are most likely to keep you healthy. Ask your health care provider for more information. Weight and diet Eat a healthy diet  Be sure to include plenty of vegetables, fruits, low-fat dairy products, and lean protein.  Do not eat a lot of foods high in solid fats, added sugars, or salt.  Get regular exercise. This is one of the most important things you can do for your health. ? Most adults should exercise for at least 150 minutes each week. The exercise should increase your heart rate and make you sweat (moderate-intensity exercise). ? Most adults should also do strengthening exercises at least twice a week. This is in addition to the moderate-intensity exercise.  Maintain a healthy weight  Body mass index (BMI) is a measurement that can be used to identify possible weight problems. It estimates body fat based on height and weight. Your health care provider can help determine your BMI and help you achieve or maintain a healthy weight.  For females 58  years of age and older: ? A BMI below 18.5 is considered underweight. ? A BMI of 18.5 to 24.9 is normal. ? A BMI of 25 to 29.9 is considered overweight. ? A BMI of 30 and above is considered obese.  Watch levels of cholesterol and blood lipids  You should start having your blood tested for lipids and cholesterol at 52 years of age, then have this test every 5 years.  You may need to have your cholesterol levels checked more often if: ? Your lipid or cholesterol levels are high. ? You are older than 52 years of age. ? You are at high risk for heart disease.  Cancer screening Lung Cancer  Lung cancer screening is recommended for adults 67-33 years old who are at high risk for lung cancer because of a history of smoking.  A yearly low-dose CT scan of the lungs is recommended for people who: ? Currently smoke. ? Have quit within the past 15 years. ? Have at least a 30-pack-year history of smoking. A pack year is smoking an average of one pack of cigarettes a day for 1 year.  Yearly screening should continue until it has been 15 years since you quit.  Yearly screening should stop if you develop a health problem that would prevent you from having lung cancer treatment.  Breast Cancer  Practice breast self-awareness. This means understanding how your breasts normally appear and feel.  It  also means doing regular breast self-exams. Let your health care provider know about any changes, no matter how small.  If you are in your 20s or 30s, you should have a clinical breast exam (CBE) by a health care provider every 1-3 years as part of a regular health exam.  If you are 38 or older, have a CBE every year. Also consider having a breast X-ray (mammogram) every year.  If you have a family history of breast cancer, talk to your health care provider about genetic screening.  If you are at high risk for breast cancer, talk to your health care provider about having an MRI and a mammogram every  year.  Breast cancer gene (BRCA) assessment is recommended for women who have family members with BRCA-related cancers. BRCA-related cancers include: ? Breast. ? Ovarian. ? Tubal. ? Peritoneal cancers.  Results of the assessment will determine the need for genetic counseling and BRCA1 and BRCA2 testing.  Cervical Cancer Your health care provider may recommend that you be screened regularly for cancer of the pelvic organs (ovaries, uterus, and vagina). This screening involves a pelvic examination, including checking for microscopic changes to the surface of your cervix (Pap test). You may be encouraged to have this screening done every 3 years, beginning at age 19.  For women ages 42-65, health care providers may recommend pelvic exams and Pap testing every 3 years, or they may recommend the Pap and pelvic exam, combined with testing for human papilloma virus (HPV), every 5 years. Some types of HPV increase your risk of cervical cancer. Testing for HPV may also be done on women of any age with unclear Pap test results.  Other health care providers may not recommend any screening for nonpregnant women who are considered low risk for pelvic cancer and who do not have symptoms. Ask your health care provider if a screening pelvic exam is right for you.  If you have had past treatment for cervical cancer or a condition that could lead to cancer, you need Pap tests and screening for cancer for at least 20 years after your treatment. If Pap tests have been discontinued, your risk factors (such as having a new sexual partner) need to be reassessed to determine if screening should resume. Some women have medical problems that increase the chance of getting cervical cancer. In these cases, your health care provider may recommend more frequent screening and Pap tests.  Colorectal Cancer  This type of cancer can be detected and often prevented.  Routine colorectal cancer screening usually begins at 52  years of age and continues through 52 years of age.  Your health care provider may recommend screening at an earlier age if you have risk factors for colon cancer.  Your health care provider may also recommend using home test kits to check for hidden blood in the stool.  A small camera at the end of a tube can be used to examine your colon directly (sigmoidoscopy or colonoscopy). This is done to check for the earliest forms of colorectal cancer.  Routine screening usually begins at age 70.  Direct examination of the colon should be repeated every 5-10 years through 52 years of age. However, you may need to be screened more often if early forms of precancerous polyps or small growths are found.  Skin Cancer  Check your skin from head to toe regularly.  Tell your health care provider about any new moles or changes in moles, especially if there is a change  in a mole's shape or color.  Also tell your health care provider if you have a mole that is larger than the size of a pencil eraser.  Always use sunscreen. Apply sunscreen liberally and repeatedly throughout the day.  Protect yourself by wearing long sleeves, pants, a wide-brimmed hat, and sunglasses whenever you are outside.  Heart disease, diabetes, and high blood pressure  High blood pressure causes heart disease and increases the risk of stroke. High blood pressure is more likely to develop in: ? People who have blood pressure in the high end of the normal range (130-139/85-89 mm Hg). ? People who are overweight or obese. ? People who are African American.  If you are 69-6 years of age, have your blood pressure checked every 3-5 years. If you are 18 years of age or older, have your blood pressure checked every year. You should have your blood pressure measured twice-once when you are at a hospital or clinic, and once when you are not at a hospital or clinic. Record the average of the two measurements. To check your blood pressure  when you are not at a hospital or clinic, you can use: ? An automated blood pressure machine at a pharmacy. ? A home blood pressure monitor.  If you are between 40 years and 27 years old, ask your health care provider if you should take aspirin to prevent strokes.  Have regular diabetes screenings. This involves taking a blood sample to check your fasting blood sugar level. ? If you are at a normal weight and have a low risk for diabetes, have this test once every three years after 52 years of age. ? If you are overweight and have a high risk for diabetes, consider being tested at a younger age or more often. Preventing infection Hepatitis B  If you have a higher risk for hepatitis B, you should be screened for this virus. You are considered at high risk for hepatitis B if: ? You were born in a country where hepatitis B is common. Ask your health care provider which countries are considered high risk. ? Your parents were born in a high-risk country, and you have not been immunized against hepatitis B (hepatitis B vaccine). ? You have HIV or AIDS. ? You use needles to inject street drugs. ? You live with someone who has hepatitis B. ? You have had sex with someone who has hepatitis B. ? You get hemodialysis treatment. ? You take certain medicines for conditions, including cancer, organ transplantation, and autoimmune conditions.  Hepatitis C  Blood testing is recommended for: ? Everyone born from [birth year] through 1965. ? Anyone with known risk factors for hepatitis C.  Sexually transmitted infections (STIs)  You should be screened for sexually transmitted infections (STIs) including gonorrhea and chlamydia if: ? You are sexually active and are younger than 52 years of age. ? You are older than 52 years of age and your health care provider tells you that you are at risk for this type of infection. ? Your sexual activity has changed since you were last screened and you are at an increased  risk for chlamydia or gonorrhea. Ask your health care provider if you are at risk.  If you do not have HIV, but are at risk, it may be recommended that you take a prescription medicine daily to prevent HIV infection. This is called pre-exposure prophylaxis (PrEP). You are considered at risk if: ? You are sexually active and do not regularly use  condoms or know the HIV status of your partner(s). ? You take drugs by injection. ? You are sexually active with a partner who has HIV.  Talk with your health care provider about whether you are at high risk of being infected with HIV. If you choose to begin PrEP, you should first be tested for HIV. You should then be tested every 3 months for as long as you are taking PrEP. Pregnancy  If you are premenopausal and you may become pregnant, ask your health care provider about preconception counseling.  If you may become pregnant, take 400 to 800 micrograms (mcg) of folic acid every day.  If you want to prevent pregnancy, talk to your health care provider about birth control (contraception). Osteoporosis and menopause  Osteoporosis is a disease in which the bones lose minerals and strength with aging. This can result in serious bone fractures. Your risk for osteoporosis can be identified using a bone density scan.  If you are 42 years of age or older, or if you are at risk for osteoporosis and fractures, ask your health care provider if you should be screened.  Ask your health care provider whether you should take a calcium or vitamin D supplement to lower your risk for osteoporosis.  Menopause may have certain physical symptoms and risks.  Hormone replacement therapy may reduce some of these symptoms and risks. Talk to your health care provider about whether hormone replacement therapy is right for you. Follow these instructions at home:  Schedule regular health, dental, and eye exams.  Stay current with your immunizations.  Do not use any  tobacco products including cigarettes, chewing tobacco, or electronic cigarettes.  If you are pregnant, do not drink alcohol.  If you are breastfeeding, limit how much and how often you drink alcohol.  Limit alcohol intake to no more than 1 drink per day for nonpregnant women. One drink equals 12 ounces of beer, 5 ounces of wine, or 1 ounces of hard liquor.  Do not use street drugs.  Do not share needles.  Ask your health care provider for help if you need support or information about quitting drugs.  Tell your health care provider if you often feel depressed.  Tell your health care provider if you have ever been abused or do not feel safe at home. This information is not intended to replace advice given to you by your health care provider. Make sure you discuss any questions you have with your health care provider. Document Released: 02/10/2011 Document Revised: 01/03/2016 Document Reviewed: 05/01/2015 Elsevier Interactive Patient Education  [employer].
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
## 2017-10-28 ENCOUNTER — Encounter: Payer: Self-pay | Admitting: Physician Assistant

## 2017-10-28 DIAGNOSIS — L578 Other skin changes due to chronic exposure to nonionizing radiation: Secondary | ICD-10-CM | POA: Diagnosis not present

## 2017-10-28 DIAGNOSIS — L821 Other seborrheic keratosis: Secondary | ICD-10-CM | POA: Diagnosis not present

## 2017-10-28 DIAGNOSIS — D225 Melanocytic nevi of trunk: Secondary | ICD-10-CM | POA: Diagnosis not present

## 2017-10-28 DIAGNOSIS — D2239 Melanocytic nevi of other parts of face: Secondary | ICD-10-CM | POA: Diagnosis not present

## 2017-10-28 LAB — CMP14+EGFR
ALT: 10 IU/L (ref 0–32)
AST: 14 IU/L (ref 0–40)
Albumin/Globulin Ratio: 1.8 (ref 1.2–2.2)
Albumin: 4.3 g/dL (ref 3.5–5.5)
Alkaline Phosphatase: 42 IU/L (ref 39–117)
BUN/Creatinine Ratio: 12 (ref 9–23)
BUN: 11 mg/dL (ref 6–24)
Bilirubin Total: 0.3 mg/dL (ref 0.0–1.2)
CO2: 23 mmol/L (ref 20–29)
Calcium: 9.2 mg/dL (ref 8.7–10.2)
Chloride: 105 mmol/L (ref 96–106)
Creatinine, Ser: 0.91 mg/dL (ref 0.57–1.00)
GFR calc Af Amer: 84 mL/min/{1.73_m2} (ref >59)
GFR calc non Af Amer: 73 mL/min/{1.73_m2} (ref >59)
Globulin, Total: 2.4 g/dL (ref 1.5–4.5)
Glucose: 83 mg/dL (ref 65–99)
Potassium: 4.2 mmol/L (ref 3.5–5.2)
Sodium: 141 mmol/L (ref 134–144)
Total Protein: 6.7 g/dL (ref 6.0–8.5)

## 2017-10-28 LAB — CBC WITH DIFFERENTIAL/PLATELET
Basophils Absolute: 0 10*3/uL (ref 0.0–0.2)
Basos: 1 %
EOS (ABSOLUTE): 0.2 10*3/uL (ref 0.0–0.4)
Eos: 5 %
Hematocrit: 36.3 % (ref 34.0–46.6)
Hemoglobin: 11.6 g/dL (ref 11.1–15.9)
Immature Grans (Abs): 0 10*3/uL (ref 0.0–0.1)
Immature Granulocytes: 0 %
Lymphocytes Absolute: 1.4 10*3/uL (ref 0.7–3.1)
Lymphs: 31 %
MCH: 27.1 pg (ref 26.6–33.0)
MCHC: 32 g/dL (ref 31.5–35.7)
MCV: 85 fL (ref 79–97)
Monocytes Absolute: 0.3 10*3/uL (ref 0.1–0.9)
Monocytes: 7 %
Neutrophils Absolute: 2.5 10*3/uL (ref 1.4–7.0)
Neutrophils: 56 %
Platelets: 312 10*3/uL (ref 150–379)
RBC: 4.28 x10E6/uL (ref 3.77–5.28)
RDW: 17.1 % — ABNORMAL HIGH (ref 12.3–15.4)
WBC: 4.5 10*3/uL (ref 3.4–10.8)

## 2017-10-28 LAB — LIPID PANEL
Chol/HDL Ratio: 2.9 ratio (ref 0.0–4.4)
Cholesterol, Total: 153 mg/dL (ref 100–199)
HDL: 53 mg/dL (ref >39)
LDL Calculated: 82 mg/dL (ref 0–99)
Triglycerides: 92 mg/dL (ref 0–149)
VLDL Cholesterol Cal: 18 mg/dL (ref 5–40)

## 2017-10-28 LAB — VITAMIN D 25 HYDROXY (VIT D DEFICIENCY, FRACTURES): Vit D, 25-Hydroxy: 35.4 ng/mL (ref 30.0–100.0)

## 2017-11-26 ENCOUNTER — Ambulatory Visit (INDEPENDENT_AMBULATORY_CARE_PROVIDER_SITE_OTHER): Payer: BLUE CROSS/BLUE SHIELD

## 2017-11-26 DIAGNOSIS — E038 Other specified hypothyroidism: Secondary | ICD-10-CM | POA: Diagnosis not present

## 2017-11-27 LAB — TSH: TSH: 2.34 u[IU]/mL (ref 0.450–4.500)

## 2017-12-01 ENCOUNTER — Encounter: Payer: Self-pay | Admitting: Physician Assistant

## 2018-05-24 DIAGNOSIS — Z6827 Body mass index (BMI) 27.0-27.9, adult: Secondary | ICD-10-CM | POA: Diagnosis not present

## 2018-05-24 DIAGNOSIS — Z01419 Encounter for gynecological examination (general) (routine) without abnormal findings: Secondary | ICD-10-CM | POA: Diagnosis not present

## 2018-05-24 DIAGNOSIS — E039 Hypothyroidism, unspecified: Secondary | ICD-10-CM | POA: Diagnosis not present

## 2018-07-22 ENCOUNTER — Telehealth: Payer: Self-pay | Admitting: Family Medicine

## 2018-07-22 DIAGNOSIS — Z1211 Encounter for screening for malignant neoplasm of colon: Secondary | ICD-10-CM

## 2018-07-22 NOTE — Telephone Encounter (Signed)
Copied from CRM [id number]. Topic: General - Inquiry >> Jul 21, 2018 12:20 PM Lorrine KinMcGee, Demi B, NT wrote: Reason for CRM: Patient calling and states that she has been getting the call from insurance to do a cologuard. States that she would like the cologuard sent to her home. Please advise.

## 2018-07-23 NOTE — Telephone Encounter (Signed)
Please see note below. 

## 2018-09-29 NOTE — Telephone Encounter (Signed)
Order has been placed.

## 2018-10-08 ENCOUNTER — Telehealth: Payer: Self-pay | Admitting: Family Medicine

## 2018-10-08 NOTE — Telephone Encounter (Signed)
LVM for patient adv Dr. Clelia Croft is no longer with [place] and if she would like to continue with [place] give [place] a call  appnt cancelled [phone]

## 2018-10-11 ENCOUNTER — Ambulatory Visit: Payer: BLUE CROSS/BLUE SHIELD | Admitting: Family Medicine

## 2018-10-14 DIAGNOSIS — D225 Melanocytic nevi of trunk: Secondary | ICD-10-CM | POA: Diagnosis not present

## 2018-10-14 DIAGNOSIS — D2272 Melanocytic nevi of left lower limb, including hip: Secondary | ICD-10-CM | POA: Diagnosis not present

## 2018-10-14 DIAGNOSIS — L814 Other melanin hyperpigmentation: Secondary | ICD-10-CM | POA: Diagnosis not present

## 2018-10-14 DIAGNOSIS — L821 Other seborrheic keratosis: Secondary | ICD-10-CM | POA: Diagnosis not present

## 2018-10-19 DIAGNOSIS — Z1211 Encounter for screening for malignant neoplasm of colon: Secondary | ICD-10-CM | POA: Diagnosis not present

## 2018-10-23 LAB — COLOGUARD: Cologuard: NEGATIVE

## 2018-11-12 ENCOUNTER — Telehealth: Payer: Self-pay | Admitting: Family Medicine

## 2018-11-12 NOTE — Telephone Encounter (Signed)
Called pt to let her know we have not received the results as of yet and to call us if there was any additional questions or concerns.

## 2018-11-12 NOTE — Telephone Encounter (Signed)
Copied from CRM [id number]. Topic: General - Other >> Nov 11, 2018  4:07 PM Floria Raveling A wrote: Reason for CRM: pt called in and left a message on the general mailbox- She she just a rec'd a notice that her Cologuard results where in and would like someone to call with with those   [phone]

## 2018-11-16 NOTE — Telephone Encounter (Signed)
Melissa Mosley with [employer] calling to inquire if the office received the Cologuard results yet. Advised that as of 11/12/2018 they had not been received. States that she would refax those results today.

## 2018-11-24 LAB — COLOGUARD: Cologuard: NEGATIVE

## 2018-11-24 NOTE — Telephone Encounter (Signed)
Spoke to patient advised Cologuard result is negative.

## 2018-12-07 DIAGNOSIS — E039 Hypothyroidism, unspecified: Secondary | ICD-10-CM | POA: Diagnosis not present

## 2018-12-16 ENCOUNTER — Other Ambulatory Visit: Payer: Self-pay | Admitting: Physician Assistant

## 2018-12-16 DIAGNOSIS — E038 Other specified hypothyroidism: Secondary | ICD-10-CM

## 2019-01-13 DIAGNOSIS — E039 Hypothyroidism, unspecified: Secondary | ICD-10-CM | POA: Diagnosis not present

## 2019-05-30 DIAGNOSIS — Z6822 Body mass index (BMI) 22.0-22.9, adult: Secondary | ICD-10-CM | POA: Diagnosis not present

## 2019-05-30 DIAGNOSIS — Z124 Encounter for screening for malignant neoplasm of cervix: Secondary | ICD-10-CM | POA: Diagnosis not present

## 2019-05-30 DIAGNOSIS — Z01419 Encounter for gynecological examination (general) (routine) without abnormal findings: Secondary | ICD-10-CM | POA: Diagnosis not present

## 2019-10-13 ENCOUNTER — Ambulatory Visit: Payer: Self-pay

## 2019-10-13 NOTE — Progress Notes (Signed)
   Covid-19 Vaccination Clinic  Name:  Melissa Mosley    MRN: [id number] DOB: 15-Apr-1966  10/13/2019  [name] was observed post Covid-19 immunization for 15 minutes without incident. She was provided with Vaccine Information Sheet and instruction to access the V-Safe system.   [name] was instructed to call 911 with any severe reactions post vaccine: Marland Kitchen Difficulty breathing  . Swelling of face and throat  . A fast heartbeat  . A bad rash all over body  . Dizziness and weakness

## 2019-11-08 DIAGNOSIS — Z0000 Encounter for general adult medical examination without abnormal findings: Secondary | ICD-10-CM | POA: Diagnosis not present

## 2019-11-09 ENCOUNTER — Ambulatory Visit: Payer: Self-pay | Attending: Internal Medicine

## 2019-11-09 DIAGNOSIS — Z23 Encounter for immunization: Secondary | ICD-10-CM

## 2019-11-09 NOTE — Progress Notes (Signed)
   Covid-19 Vaccination Clinic  Name:  Melissa Mosley    MRN: [id number] DOB: [birth year]  11/09/2019  [name] was observed post Covid-19 immunization for 15 minutes without incident. She was provided with Vaccine Information Sheet and instruction to access the V-Safe system.   [name] was instructed to call 911 with any severe reactions post vaccine: Marland Kitchen Difficulty breathing  . Swelling of face and throat  . A fast heartbeat  . A bad rash all over body  . Dizziness and weakness   Immunizations Administered    Name Date Dose VIS Date Route   Pfizer COVID-19 Vaccine 11/09/2019 10:45 AM 0.3 mL 07/22/2019 Intramuscular   Manufacturer: [employer], [employer]   Lot: [id number]   NDC: [address]-8

## 2019-11-15 DIAGNOSIS — E039 Hypothyroidism, unspecified: Secondary | ICD-10-CM | POA: Diagnosis not present

## 2019-11-15 DIAGNOSIS — Z0000 Encounter for general adult medical examination without abnormal findings: Secondary | ICD-10-CM | POA: Diagnosis not present

## 2020-03-13 DIAGNOSIS — Z03818 Encounter for observation for suspected exposure to other biological agents ruled out: Secondary | ICD-10-CM | POA: Diagnosis not present

## 2020-04-24 DIAGNOSIS — M25572 Pain in left ankle and joints of left foot: Secondary | ICD-10-CM | POA: Diagnosis not present

## 2020-05-15 DIAGNOSIS — E039 Hypothyroidism, unspecified: Secondary | ICD-10-CM | POA: Diagnosis not present

## 2020-05-29 DIAGNOSIS — Z01419 Encounter for gynecological examination (general) (routine) without abnormal findings: Secondary | ICD-10-CM | POA: Diagnosis not present

## 2020-06-05 DIAGNOSIS — M25572 Pain in left ankle and joints of left foot: Secondary | ICD-10-CM | POA: Diagnosis not present

## 2020-06-13 DIAGNOSIS — Z1231 Encounter for screening mammogram for malignant neoplasm of breast: Secondary | ICD-10-CM | POA: Diagnosis not present

## 2021-10-08 DIAGNOSIS — Z0000 Encounter for general adult medical examination without abnormal findings: Secondary | ICD-10-CM | POA: Diagnosis not present

## 2021-10-08 DIAGNOSIS — R7309 Other abnormal glucose: Secondary | ICD-10-CM | POA: Diagnosis not present

## 2021-10-08 DIAGNOSIS — E039 Hypothyroidism, unspecified: Secondary | ICD-10-CM | POA: Diagnosis not present

## 2021-10-15 DIAGNOSIS — L821 Other seborrheic keratosis: Secondary | ICD-10-CM | POA: Diagnosis not present

## 2022-04-01 DIAGNOSIS — Z0000 Encounter for general adult medical examination without abnormal findings: Secondary | ICD-10-CM | POA: Diagnosis not present

## 2022-04-01 DIAGNOSIS — R7309 Other abnormal glucose: Secondary | ICD-10-CM | POA: Diagnosis not present

## 2022-04-01 DIAGNOSIS — E039 Hypothyroidism, unspecified: Secondary | ICD-10-CM | POA: Diagnosis not present

## 2022-04-01 DIAGNOSIS — Z13220 Encounter for screening for lipoid disorders: Secondary | ICD-10-CM | POA: Diagnosis not present

## 2022-04-08 DIAGNOSIS — Z1211 Encounter for screening for malignant neoplasm of colon: Secondary | ICD-10-CM | POA: Diagnosis not present

## 2022-04-08 DIAGNOSIS — Z0000 Encounter for general adult medical examination without abnormal findings: Secondary | ICD-10-CM | POA: Diagnosis not present

## 2022-04-08 DIAGNOSIS — R7309 Other abnormal glucose: Secondary | ICD-10-CM | POA: Diagnosis not present

## 2022-04-08 DIAGNOSIS — E039 Hypothyroidism, unspecified: Secondary | ICD-10-CM | POA: Diagnosis not present

## 2022-04-28 DIAGNOSIS — M79644 Pain in right finger(s): Secondary | ICD-10-CM | POA: Diagnosis not present

## 2022-04-29 DIAGNOSIS — S62524A Nondisplaced fracture of distal phalanx of right thumb, initial encounter for closed fracture: Secondary | ICD-10-CM | POA: Diagnosis not present

## 2022-05-01 ENCOUNTER — Telehealth: Payer: Self-pay | Admitting: Gastroenterology

## 2022-05-01 NOTE — Telephone Encounter (Signed)
This lady will be a new patient in our office. States she spoke with Dr Ardis Hughs before about getting her procedure without anesthesia with Dr Ardis Hughs said it was okay. But now that he is on leave, she is wondering if the supervising MD of the day could do her procedure without it if she schedules with him. Requesting a call back. Please call to advise.

## 2022-05-01 NOTE — Telephone Encounter (Signed)
See below and advise as DOD.

## 2022-05-02 NOTE — Telephone Encounter (Signed)
Called patient to schedule, no response. Left a detailed vm for patient to call back and schedule.

## 2022-05-05 ENCOUNTER — Encounter: Payer: Self-pay | Admitting: Gastroenterology

## 2022-05-05 NOTE — Telephone Encounter (Signed)
Patient is schedule for 11/3. Thank you

## 2022-05-20 ENCOUNTER — Ambulatory Visit (AMBULATORY_SURGERY_CENTER): Payer: Self-pay

## 2022-05-20 VITALS — Ht 66.0 in | Wt 158.0 lb

## 2022-05-20 DIAGNOSIS — S62524A Nondisplaced fracture of distal phalanx of right thumb, initial encounter for closed fracture: Secondary | ICD-10-CM | POA: Diagnosis not present

## 2022-05-20 DIAGNOSIS — Z1211 Encounter for screening for malignant neoplasm of colon: Secondary | ICD-10-CM

## 2022-05-20 MED ORDER — NA SULFATE-K SULFATE-MG SULF 17.5-3.13-1.6 GM/177ML PO SOLN: 1.0000 | Freq: Once | ORAL | 0 refills | Status: AC

## 2022-05-20 NOTE — Progress Notes (Signed)
No egg or soy allergy known to patient  No issues known to pt with past sedation with any surgeries or procedures Patient denies ever being told they had issues or difficulty with intubation  No FH of Malignant Hyperthermia Pt is not on diet pills Pt is not on  home 02  Pt is not on blood thinners  Pt denies issues with constipation  No A fib or A flutter Have any cardiac testing pending--no Pt instructed to use Singlecare.[email or web address] or GoodRx for a price reduction on prep   

## 2022-06-05 ENCOUNTER — Encounter: Payer: Self-pay | Admitting: Gastroenterology

## 2022-06-13 ENCOUNTER — Ambulatory Visit (AMBULATORY_SURGERY_CENTER): Payer: 59 | Admitting: Gastroenterology

## 2022-06-13 ENCOUNTER — Encounter: Payer: Self-pay | Admitting: Gastroenterology

## 2022-06-13 VITALS — BP 106/60 | HR 51 | Temp 98.0°F | Resp 13 | Ht 66.0 in | Wt 158.0 lb

## 2022-06-13 DIAGNOSIS — Z1211 Encounter for screening for malignant neoplasm of colon: Secondary | ICD-10-CM

## 2022-06-13 MED ORDER — SODIUM CHLORIDE 0.9 % IV SOLN: 500.0000 mL | Freq: Once | INTRAVENOUS | Status: DC

## 2022-06-13 NOTE — Progress Notes (Signed)
A/ox3, pleased with MAC, report to RN 

## 2022-06-13 NOTE — Op Note (Signed)
[name]: Melissa Mosley Procedure Date: 06/13/2022 7:38 AM MRN: [id number] Endoscopist: Nicki Reaper E. Candis Schatz , MD, [id number] Age: 56 Referring MD:  Date of Birth: Dec 23, 1965 Gender: Female Account #: [id number] Procedure:                Colonoscopy Indications:              Screening for colorectal malignant neoplasm, This                            is the patient's first colonoscopy Medicines:                None Procedure:                Pre-Anesthesia Assessment:                           - Prior to the procedure, a History and Physical                            was performed, and patient medications and                            allergies were reviewed. The patient's tolerance of                            previous anesthesia was also reviewed. The risks                            and benefits of the procedure and the sedation                            options and risks were discussed with the patient.                            All questions were answered, and informed consent                            was obtained. Prior Anticoagulants: The patient has                            taken no anticoagulant or antiplatelet agents. ASA                            Grade Assessment: I - A normal, healthy patient.                            After reviewing the risks and benefits, the patient                            was deemed in satisfactory condition to undergo the                            procedure.  After obtaining informed consent, the colonoscope                            was passed under direct vision. Throughout the                            procedure, the patient's blood pressure, pulse, and                            oxygen saturations were monitored continuously. The                            CF [id number] #[id number] was introduced through the anus                            and advanced to the the terminal ileum, with                             identification of the appendiceal orifice and IC                            valve. The colonoscopy was performed without                            difficulty. The patient tolerated the procedure                            well. The quality of the bowel preparation was                            excellent. The terminal ileum, ileocecal valve,                            appendiceal orifice, and rectum were photographed.                            The bowel preparation used was SUPREP via split                            dose instruction. Scope In: 8:07:03 AM Scope Out: 8:24:46 AM Scope Withdrawal Time: 0 hours 13 minutes 4 seconds  Total Procedure Duration: 0 hours 17 minutes 43 seconds  Findings:                 The perianal and digital rectal examinations were                            normal. Pertinent negatives include normal                            sphincter tone and no palpable rectal lesions.                           A single small-mouthed diverticulum was found in  the ascending colon.                           The exam was otherwise normal throughout the                            examined colon.                           The terminal ileum appeared normal.                           The retroflexed view of the distal rectum and anal                            verge was normal and showed no anal or rectal                            abnormalities. Complications:            No immediate complications. Estimated Blood Loss:     Estimated blood loss: none. Impression:               - Diverticulosis in the ascending colon.                           - The examined portion of the ileum was normal.                           - The distal rectum and anal verge are normal on                            retroflexion view.                           - No specimens collected. Recommendation:           - Patient has a contact number available  for                            emergencies. The signs and symptoms of potential                            delayed complications were discussed with the                            patient. Return to normal activities tomorrow.                            Written discharge instructions were provided to the                            patient.                           - Resume previous diet.                           -  Continue present medications.                           - Repeat colonoscopy in 10 years for screening                            purposes. Sola Margolis E. Tomasa Rand, MD 06/13/2022 8:33:31 AM This report has been signed electronically.

## 2022-06-13 NOTE — Patient Instructions (Signed)
YOU HAD AN ENDOSCOPIC PROCEDURE TODAY AT [facility]:   Refer to the procedure report that was given to you for any specific questions about what was found during the examination.  If the procedure report does not answer your questions, please call your gastroenterologist to clarify.  If you requested that your care partner not be given the details of your procedure findings, then the procedure report has been included in a sealed envelope for you to review at your convenience later.  YOU SHOULD EXPECT: Some feelings of bloating in the abdomen. Passage of more gas than usual.  Walking can help get rid of the air that was put into your GI tract during the procedure and reduce the bloating. If you had a lower endoscopy (such as a colonoscopy or flexible sigmoidoscopy) you may notice spotting of blood in your stool or on the toilet paper. If you underwent a bowel prep for your procedure, you may not have a normal bowel movement for a few days.  Please Note:  You might notice some irritation and congestion in your nose or some drainage.  This is from the oxygen used during your procedure.  There is no need for concern and it should clear up in a day or so.  SYMPTOMS TO REPORT IMMEDIATELY:  Following lower endoscopy (colonoscopy or flexible sigmoidoscopy):  Excessive amounts of blood in the stool  Significant tenderness or worsening of abdominal pains  Swelling of the abdomen that is new, acute  Fever of 100F or higher   For urgent or emergent issues, a gastroenterologist can be reached at any hour by calling [phone]. Do not use MyChart messaging for urgent concerns.    DIET:  We do recommend a small meal at first, but then you may proceed to your regular diet.  Drink plenty of fluids but you should avoid alcoholic beverages for 24 hours.  ACTIVITY:  You should plan to take it easy for the rest of today.  FOLLOW UP: Our staff will call the number listed on your records the  next business day following your procedure.  We will call around 7:15- 8:00 am to check on you and address any questions or concerns that you may have regarding the information given to you following your procedure. If we do not reach you, we will leave a message.      SIGNATURES/CONFIDENTIALITY: You and/or your care partner have signed paperwork which will be entered into your electronic medical record.  These signatures attest to the fact that that the information above on your After Visit Summary has been reviewed and is understood.  Full responsibility of the confidentiality of this discharge information lies with you and/or your care-partner.

## 2022-06-13 NOTE — Progress Notes (Signed)
Pt's states no medical or surgical changes since previsit or office visit. 

## 2022-06-13 NOTE — Progress Notes (Signed)
Pt requests no anesthesia for this procedure

## 2022-06-13 NOTE — Progress Notes (Signed)
[facility] History and Physical   Primary Care Physician:  Melissa Morning, DO   Reason for Procedure:   Colon cancer screening  Plan:    Screening colonoscopy     HPI: Melissa Mosley is a 56 y.o. female undergoing initial average risk screening colonoscopy.  She has no family history of colon cancer and no chronic GI symptoms.  She requested her colonoscopy be done without sedation.   Past Medical History:  Diagnosis Date   Thyroid disease     No past surgical history on file.  Prior to Admission medications   Medication Sig Start Date End Date Taking? Authorizing Provider  ARMOUR THYROID 60 MG tablet Take 60 mg by mouth daily. 04/11/22  Yes [provider]  ARMOUR THYROID 30 MG tablet TAKE 1 TABLET BY MOUTH DAILY BEFORE BREAKFAST. Patient not taking: Reported on 06/13/2022 12/16/18   McVey, Melissa Mink, PA-C  Cholecalciferol (VITAMIN D-1000 MAX ST) 25 MCG (1000 UT) tablet Take by mouth.    [provider]    Current Outpatient Medications  Medication Sig Dispense Refill   ARMOUR THYROID 60 MG tablet Take 60 mg by mouth daily.     ARMOUR THYROID 30 MG tablet TAKE 1 TABLET BY MOUTH DAILY BEFORE BREAKFAST. (Patient not taking: Reported on 06/13/2022) 30 tablet 0   Cholecalciferol (VITAMIN D-1000 MAX ST) 25 MCG (1000 UT) tablet Take by mouth.     Current Facility-Administered Medications  Medication Dose Route Frequency Provider Last Rate Last Admin   0.9 %  sodium chloride infusion  500 mL Intravenous Once Melissa November, MD        Allergies as of 06/13/2022 - Review Complete 06/13/2022  Allergen Reaction Noted   Avelox [moxifloxacin hcl in nacl] Rash 11/04/2011    Family History  Problem Relation Age of Onset   Arthritis Mother    Hypertension Mother    Colon polyps Father    Arthritis Father    Hypertension Father    Thyroid disease Sister    Arthritis Maternal Grandmother    Hypertension Maternal Grandfather    Stroke  Paternal Grandmother    Hypertension Paternal Grandfather    Colon cancer Neg Hx    Esophageal cancer Neg Hx    Rectal cancer Neg Hx    Stomach cancer Neg Hx     Social History   Socioeconomic History   Marital status: Married    Spouse name: Not on file   Number of children: Not on file   Years of education: Not on file   Highest education level: Not on file  Occupational History   Not on file  Tobacco Use   Smoking status: Never   Smokeless tobacco: Never  Substance and Sexual Activity   Alcohol use: No   Drug use: No   Sexual activity: Yes    Birth control/protection: Surgical  Other Topics Concern   Not on file  Social History Narrative   Not on file   Social Determinants of Health   Financial Resource Strain: Not on file  Food Insecurity: Not on file  Transportation Needs: Not on file  Physical Activity: Not on file  Stress: Not on file  Social Connections: Not on file  Intimate Partner Violence: Not on file    Review of Systems:  All other review of systems negative except as mentioned in the HPI.  Physical Exam: Vital signs BP (!) 84/47   Pulse 64   Temp 98 F (36.7 C)   Ht  5\' 6"  (1.676 m)   Wt 158 lb (71.7 kg)   LMP 10/11/2017   SpO2 98%   BMI 25.50 kg/m   General:   Alert,  Well-developed, well-nourished, pleasant and cooperative in NAD Airway:  Mallampati 1 Lungs:  Clear throughout to auscultation.   Heart:  Regular rate and rhythm; no murmurs, clicks, rubs,  or gallops. Abdomen:  Soft, nontender and nondistended. Normal bowel sounds.   Neuro/Psych:  Normal mood and affect. A and O x 3   Melissa Hilleary E. 12/11/2017, MD [facility]

## 2022-06-16 ENCOUNTER — Telehealth: Payer: Self-pay | Admitting: *Deleted

## 2022-06-16 NOTE — Telephone Encounter (Signed)
  Follow up Call-     06/13/2022    7:24 AM  Call back number  Post procedure Call Back phone  # [phone]  Permission to leave phone message Yes     Patient questions:  Message left to call us if necessary.

## 2022-07-08 DIAGNOSIS — S99921A Unspecified injury of right foot, initial encounter: Secondary | ICD-10-CM | POA: Diagnosis not present

## 2022-08-06 DIAGNOSIS — R059 Cough, unspecified: Secondary | ICD-10-CM | POA: Diagnosis not present

## 2022-10-13 DIAGNOSIS — E039 Hypothyroidism, unspecified: Secondary | ICD-10-CM | POA: Diagnosis not present

## 2022-10-13 DIAGNOSIS — R7309 Other abnormal glucose: Secondary | ICD-10-CM | POA: Diagnosis not present

## 2022-11-11 DIAGNOSIS — M67442 Ganglion, left hand: Secondary | ICD-10-CM | POA: Diagnosis not present

## 2022-11-11 DIAGNOSIS — M79642 Pain in left hand: Secondary | ICD-10-CM | POA: Diagnosis not present

## 2023-03-17 DIAGNOSIS — Z13220 Encounter for screening for lipoid disorders: Secondary | ICD-10-CM | POA: Diagnosis not present

## 2023-03-17 DIAGNOSIS — R7309 Other abnormal glucose: Secondary | ICD-10-CM | POA: Diagnosis not present

## 2023-03-17 DIAGNOSIS — Z79899 Other long term (current) drug therapy: Secondary | ICD-10-CM | POA: Diagnosis not present

## 2023-03-17 DIAGNOSIS — E039 Hypothyroidism, unspecified: Secondary | ICD-10-CM | POA: Diagnosis not present

## 2023-03-18 DIAGNOSIS — M545 Low back pain, unspecified: Secondary | ICD-10-CM | POA: Diagnosis not present

## 2023-03-18 DIAGNOSIS — M9901 Segmental and somatic dysfunction of cervical region: Secondary | ICD-10-CM | POA: Diagnosis not present

## 2023-03-18 DIAGNOSIS — M9905 Segmental and somatic dysfunction of pelvic region: Secondary | ICD-10-CM | POA: Diagnosis not present

## 2023-03-18 DIAGNOSIS — M7918 Myalgia, other site: Secondary | ICD-10-CM | POA: Diagnosis not present

## 2023-03-18 DIAGNOSIS — M542 Cervicalgia: Secondary | ICD-10-CM | POA: Diagnosis not present

## 2023-03-18 DIAGNOSIS — M9903 Segmental and somatic dysfunction of lumbar region: Secondary | ICD-10-CM | POA: Diagnosis not present

## 2023-03-18 DIAGNOSIS — M9904 Segmental and somatic dysfunction of sacral region: Secondary | ICD-10-CM | POA: Diagnosis not present

## 2023-03-24 DIAGNOSIS — E039 Hypothyroidism, unspecified: Secondary | ICD-10-CM | POA: Diagnosis not present

## 2023-03-24 DIAGNOSIS — Z0000 Encounter for general adult medical examination without abnormal findings: Secondary | ICD-10-CM | POA: Diagnosis not present

## 2023-03-24 DIAGNOSIS — R7309 Other abnormal glucose: Secondary | ICD-10-CM | POA: Diagnosis not present

## 2023-03-24 DIAGNOSIS — Z79899 Other long term (current) drug therapy: Secondary | ICD-10-CM | POA: Diagnosis not present

## 2023-04-14 DIAGNOSIS — Z01419 Encounter for gynecological examination (general) (routine) without abnormal findings: Secondary | ICD-10-CM | POA: Diagnosis not present

## 2023-04-14 DIAGNOSIS — Z1231 Encounter for screening mammogram for malignant neoplasm of breast: Secondary | ICD-10-CM | POA: Diagnosis not present

## 2023-04-24 DIAGNOSIS — M65312 Trigger thumb, left thumb: Secondary | ICD-10-CM | POA: Diagnosis not present

## 2023-04-24 DIAGNOSIS — M67442 Ganglion, left hand: Secondary | ICD-10-CM | POA: Diagnosis not present

## 2023-04-24 DIAGNOSIS — M65311 Trigger thumb, right thumb: Secondary | ICD-10-CM | POA: Diagnosis not present

## 2023-05-15 DIAGNOSIS — Z23 Encounter for immunization: Secondary | ICD-10-CM | POA: Diagnosis not present

## 2023-09-22 DIAGNOSIS — M25561 Pain in right knee: Secondary | ICD-10-CM | POA: Diagnosis not present

## 2023-10-06 DIAGNOSIS — M25561 Pain in right knee: Secondary | ICD-10-CM | POA: Diagnosis not present

## 2024-03-22 DIAGNOSIS — Z79899 Other long term (current) drug therapy: Secondary | ICD-10-CM | POA: Diagnosis not present

## 2024-03-22 DIAGNOSIS — R7309 Other abnormal glucose: Secondary | ICD-10-CM | POA: Diagnosis not present

## 2024-03-22 DIAGNOSIS — Z13220 Encounter for screening for lipoid disorders: Secondary | ICD-10-CM | POA: Diagnosis not present

## 2024-03-22 DIAGNOSIS — E039 Hypothyroidism, unspecified: Secondary | ICD-10-CM | POA: Diagnosis not present

## 2024-03-29 DIAGNOSIS — R7309 Other abnormal glucose: Secondary | ICD-10-CM | POA: Diagnosis not present

## 2024-03-29 DIAGNOSIS — Z79899 Other long term (current) drug therapy: Secondary | ICD-10-CM | POA: Diagnosis not present

## 2024-03-29 DIAGNOSIS — E039 Hypothyroidism, unspecified: Secondary | ICD-10-CM | POA: Diagnosis not present

## 2024-03-29 DIAGNOSIS — Z0000 Encounter for general adult medical examination without abnormal findings: Secondary | ICD-10-CM | POA: Diagnosis not present

## 2024-03-29 DIAGNOSIS — Z13220 Encounter for screening for lipoid disorders: Secondary | ICD-10-CM | POA: Diagnosis not present

## 2024-04-14 DIAGNOSIS — Z01419 Encounter for gynecological examination (general) (routine) without abnormal findings: Secondary | ICD-10-CM | POA: Diagnosis not present
